# Patient Record
Sex: Male | Born: 1940 | Race: White | Hispanic: No | Marital: Married | Smoking: Former smoker
Health system: Southern US, Community
[De-identification: ages and names within clinical notes are randomized; demographics above are authoritative.]

## PROBLEM LIST (undated history)

## (undated) DIAGNOSIS — G4733 Obstructive sleep apnea (adult) (pediatric): Secondary | ICD-10-CM

## (undated) DIAGNOSIS — K4090 Unilateral inguinal hernia, without obstruction or gangrene, not specified as recurrent: Secondary | ICD-10-CM

## (undated) DIAGNOSIS — G20A1 Parkinson's disease without dyskinesia, without mention of fluctuations: Secondary | ICD-10-CM

## (undated) DIAGNOSIS — R7303 Prediabetes: Secondary | ICD-10-CM

## (undated) DIAGNOSIS — C801 Malignant (primary) neoplasm, unspecified: Secondary | ICD-10-CM

## (undated) DIAGNOSIS — Z974 Presence of external hearing-aid: Secondary | ICD-10-CM

## (undated) DIAGNOSIS — N529 Male erectile dysfunction, unspecified: Secondary | ICD-10-CM

## (undated) DIAGNOSIS — Z8619 Personal history of other infectious and parasitic diseases: Secondary | ICD-10-CM

## (undated) DIAGNOSIS — I10 Essential (primary) hypertension: Secondary | ICD-10-CM

## (undated) DIAGNOSIS — G2581 Restless legs syndrome: Secondary | ICD-10-CM

## (undated) DIAGNOSIS — R1310 Dysphagia, unspecified: Secondary | ICD-10-CM

## (undated) HISTORY — DX: Personal history of other infectious and parasitic diseases: Z86.19

## (undated) HISTORY — DX: Male erectile dysfunction, unspecified: N52.9

## (undated) HISTORY — DX: Prediabetes: R73.03

## (undated) HISTORY — DX: Obstructive sleep apnea (adult) (pediatric): G47.33

## (undated) HISTORY — DX: Parkinson's disease without dyskinesia, without mention of fluctuations: G20.A1

## (undated) HISTORY — DX: Restless legs syndrome: G25.81

---

## 2010-07-27 ENCOUNTER — Ambulatory Visit: Payer: Self-pay | Admitting: Family Medicine

## 2011-10-11 DIAGNOSIS — G5793 Unspecified mononeuropathy of bilateral lower limbs: Secondary | ICD-10-CM | POA: Insufficient documentation

## 2011-10-11 DIAGNOSIS — R739 Hyperglycemia, unspecified: Secondary | ICD-10-CM | POA: Insufficient documentation

## 2011-10-11 DIAGNOSIS — Z8042 Family history of malignant neoplasm of prostate: Secondary | ICD-10-CM | POA: Insufficient documentation

## 2011-10-11 DIAGNOSIS — R809 Proteinuria, unspecified: Secondary | ICD-10-CM | POA: Insufficient documentation

## 2011-10-11 DIAGNOSIS — I10 Essential (primary) hypertension: Secondary | ICD-10-CM | POA: Insufficient documentation

## 2012-05-16 ENCOUNTER — Ambulatory Visit: Payer: Self-pay | Admitting: Unknown Physician Specialty

## 2012-05-16 LAB — HM COLONOSCOPY

## 2012-05-20 LAB — PATHOLOGY REPORT

## 2012-05-26 DIAGNOSIS — Z86010 Personal history of colonic polyps: Secondary | ICD-10-CM | POA: Insufficient documentation

## 2013-04-24 ENCOUNTER — Ambulatory Visit: Payer: Self-pay | Admitting: Family Medicine

## 2013-04-24 DIAGNOSIS — G4733 Obstructive sleep apnea (adult) (pediatric): Secondary | ICD-10-CM | POA: Insufficient documentation

## 2013-04-24 HISTORY — PX: OTHER SURGICAL HISTORY: SHX169

## 2013-05-13 ENCOUNTER — Ambulatory Visit: Payer: Self-pay | Admitting: Family Medicine

## 2013-11-11 LAB — BASIC METABOLIC PANEL
BUN: 11 mg/dL (ref 4–21)
CREATININE: 1.2 mg/dL (ref 0.6–1.3)
GLUCOSE: 113 mg/dL
POTASSIUM: 4.5 mmol/L (ref 3.4–5.3)
SODIUM: 139 mmol/L (ref 137–147)

## 2013-11-11 LAB — LIPID PANEL
Cholesterol: 156 mg/dL (ref 0–200)
HDL: 36 mg/dL (ref 35–70)
LDL CALC: 89 mg/dL
Triglycerides: 153 mg/dL (ref 40–160)

## 2013-11-11 LAB — PSA: PSA: 0.3

## 2013-11-11 LAB — HEMOGLOBIN A1C: HEMOGLOBIN A1C: 6.2

## 2014-06-27 ENCOUNTER — Other Ambulatory Visit: Payer: Self-pay | Admitting: Family Medicine

## 2014-09-19 ENCOUNTER — Ambulatory Visit
Admission: EM | Admit: 2014-09-19 | Discharge: 2014-09-19 | Disposition: A | Discharge status: Home / Self Care | Payer: Medicare Other | Attending: Family Medicine | Admitting: Family Medicine

## 2014-09-19 ENCOUNTER — Ambulatory Visit: Payer: Medicare Other

## 2014-09-19 ENCOUNTER — Encounter: Payer: Self-pay | Admitting: *Deleted

## 2014-09-19 DIAGNOSIS — T148 Other injury of unspecified body region: Secondary | ICD-10-CM | POA: Diagnosis not present

## 2014-09-19 DIAGNOSIS — M25461 Effusion, right knee: Secondary | ICD-10-CM

## 2014-09-19 DIAGNOSIS — T148XXA Other injury of unspecified body region, initial encounter: Secondary | ICD-10-CM

## 2014-09-19 DIAGNOSIS — I10 Essential (primary) hypertension: Secondary | ICD-10-CM

## 2014-09-19 DIAGNOSIS — S8391XA Sprain of unspecified site of right knee, initial encounter: Secondary | ICD-10-CM

## 2014-09-19 DIAGNOSIS — S8392XA Sprain of unspecified site of left knee, initial encounter: Secondary | ICD-10-CM

## 2014-09-19 HISTORY — DX: Essential (primary) hypertension: I10

## 2014-09-19 MED ORDER — BACIT-POLY-NEO HC 1 % EX OINT: 1.0000 "application " | TOPICAL_OINTMENT | Freq: Two times a day (BID) | CUTANEOUS | Status: DC

## 2014-09-19 MED ORDER — ACETAMINOPHEN 500 MG PO TABS: 1000.0000 mg | ORAL_TABLET | Freq: Four times a day (QID) | ORAL | Status: DC | PRN

## 2014-09-19 MED ORDER — CEPHALEXIN 500 MG PO CAPS: 500.0000 mg | ORAL_CAPSULE | Freq: Two times a day (BID) | ORAL | Status: DC

## 2014-09-19 NOTE — ED Provider Notes (Signed)
CSN: [id number]     Arrival date & time 09/19/14  1158 History   First MD Initiated Contact with Patient 09/19/14 1304     Chief Complaint  Patient presents with  . Fall   (Consider location/radiation/quality/duration/timing/severity/associated sxs/prior Treatment) HPI Comments: Married caucasian male here for evaluation of knee and elbow swelling after tripped/fall in parking lot yesterday.  Hit right elbow in asphalt and tore off skin full range of motion slightly sore.  Right knee swelled up a lot he iced overnight and rested/elevated.  Took motrin and tylenol.  Both knees scraped up as wearing shorts.  Applied neosporin to scrapes and washed them out last night.  Denied hitting head or losing consciousness.  Patient requesting xray right knee  The history is provided by the patient.    Past Medical History  Diagnosis Date  . Hypertension    History reviewed. No pertinent past surgical history. History reviewed. No pertinent family history. Social History  Substance Use Topics  . Smoking status: Former Research scientist (life sciences)  . Smokeless tobacco: None  . Alcohol Use: No    Review of Systems  Constitutional: Negative for fever, chills, diaphoresis, activity change, appetite change, fatigue and unexpected weight change.  HENT: Negative for congestion, dental problem, drooling, ear discharge, ear pain and facial swelling.   Eyes: Negative for photophobia, pain, discharge, redness, itching and visual disturbance.  Respiratory: Negative for cough, choking, chest tightness, shortness of breath, wheezing and stridor.   Cardiovascular: Negative for chest pain, palpitations and leg swelling.  Gastrointestinal: Negative for nausea, vomiting, abdominal pain, diarrhea, blood in stool, abdominal distention and rectal pain.  Endocrine: Negative for cold intolerance and heat intolerance.  Genitourinary: Negative for dysuria.  Musculoskeletal: Positive for myalgias, joint swelling and arthralgias. Negative for  back pain, gait problem, neck pain and neck stiffness.  Skin: Positive for color change, rash and wound. Negative for pallor.  Allergic/Immunologic: Negative for environmental allergies and food allergies.  Neurological: Negative for dizziness, tremors, seizures, syncope, facial asymmetry, speech difficulty, weakness, light-headedness, numbness and headaches.  Hematological: Negative for adenopathy. Does not bruise/bleed easily.  Psychiatric/Behavioral: Negative for behavioral problems, confusion, sleep disturbance and agitation.    Allergies  Review of patient's allergies indicates no known allergies.  Home Medications   Prior to Admission medications   Medication Sig Start Date End Date Taking? Authorizing Provider  amLODipine (NORVASC) 5 MG tablet TAKE 1 TABLET BY MOUTH EVERY DAY 06/27/14  Yes Birdie Sons, MD  atenolol (TENORMIN) 100 MG tablet Take 100 mg by mouth daily.   Yes Historical Provider, MD  losartan (COZAAR) 25 MG tablet Take 25 mg by mouth daily.   Yes Historical Provider, MD  acetaminophen (TYLENOL) 500 MG tablet Take 2 tablets (1,000 mg total) by mouth every 6 (six) hours as needed for moderate pain. 09/19/14   Olen Cordial, NP  bacitracin-neomycin-polymyxin-hydrocortisone (CORTISPORIN) 1 % ointment Apply 1 application topically 2 (two) times daily. 09/19/14   Olen Cordial, NP  cephALEXin (KEFLEX) 500 MG capsule Take 1 capsule (500 mg total) by mouth 2 (two) times daily. 09/19/14   Olen Cordial, NP   Meds Ordered and Administered this Visit  Medications - No data to display  BP 163/79 mmHg  Pulse 87  Temp(Src) 97.6 F (36.4 C) (Oral)  Ht 5\' 9"  (1.753 m)  Wt 204 lb (92.534 kg)  BMI 30.11 kg/m2  SpO2 98% No data found.   Physical Exam  Constitutional: He is oriented to person, place, and time.  Vital signs are normal. He appears well-developed and well-nourished. No distress.  HENT:  Head: Normocephalic and atraumatic.  Right Ear: Hearing, tympanic  membrane, external ear and ear canal normal.  Left Ear: Hearing, tympanic membrane, external ear and ear canal normal.  Nose: Nose normal. No mucosal edema, rhinorrhea, nose lacerations, sinus tenderness, nasal deformity, septal deviation or nasal septal hematoma. No epistaxis.  No foreign bodies. Right sinus exhibits no maxillary sinus tenderness and no frontal sinus tenderness. Left sinus exhibits no maxillary sinus tenderness and no frontal sinus tenderness.  Mouth/Throat: Uvula is midline, oropharynx is clear and moist and mucous membranes are normal. Mucous membranes are not pale, not dry and not cyanotic. He does not have dentures. No oral lesions. No trismus in the jaw. Normal dentition. No dental abscesses, uvula swelling, lacerations or dental caries. No oropharyngeal exudate, posterior oropharyngeal edema, posterior oropharyngeal erythema or tonsillar abscesses.  Eyes: Conjunctivae, EOM and lids are normal. Pupils are equal, round, and reactive to light. Right eye exhibits no discharge. Left eye exhibits no discharge. No scleral icterus.  Neck: Trachea normal and normal range of motion. Neck supple. No tracheal tenderness, no spinous process tenderness and no muscular tenderness present. No rigidity. No tracheal deviation, no edema, no erythema and normal range of motion present.  Cardiovascular: Normal rate, regular rhythm, normal heart sounds and intact distal pulses.  Exam reveals no gallop and no friction rub.   No murmur heard. Pulmonary/Chest: Effort normal and breath sounds normal. No accessory muscle usage or stridor. No respiratory distress. He has no decreased breath sounds. He has no wheezes. He has no rhonchi. He has no rales. He exhibits no tenderness.  Abdominal: Soft. He exhibits no distension.  Musculoskeletal: He exhibits edema and tenderness.       Right shoulder: Normal.       Left shoulder: Normal.       Right elbow: He exhibits decreased range of motion, swelling and  laceration. He exhibits no effusion and no deformity. Tenderness found. No radial head, no medial epicondyle, no lateral epicondyle and no olecranon process tenderness noted.       Left elbow: Normal.       Right wrist: Normal.       Left wrist: Normal.       Right hip: Normal.       Left hip: Normal.       Right knee: He exhibits decreased range of motion, swelling, effusion, ecchymosis, erythema and bony tenderness. He exhibits no deformity, no laceration, normal alignment, no LCL laxity, normal patellar mobility, normal meniscus and no MCL laxity. Tenderness found. Patellar tendon tenderness noted. No medial joint line, no lateral joint line, no MCL and no LCL tenderness noted.       Left knee: Normal.       Right ankle: Normal.       Left ankle: Normal.       Cervical back: Normal.       Thoracic back: Normal.       Lumbar back: Normal.       Right forearm: Normal.       Left forearm: Normal.       Arms:      Right hand: Normal.       Left hand: Normal.       Right upper leg: Normal.       Left upper leg: Normal.       Right lower leg: Normal.       Left  lower leg: Normal.       Legs:      Right foot: Normal.       Left foot: Normal.  0-1+/4 right elbow edema full AROM tender over laceration soft tissue only no bony tenderness on evaluation; negative ant/post drawer tests bilateral knees; valgus/varus stress no laxity; patellar tendons TTP along with right patellar with direct pressure; left knee crepitus with flexion/extension  Lymphadenopathy:    He has no cervical adenopathy.  Neurological: He is alert and oriented to person, place, and time. He exhibits normal muscle tone. Coordination normal.  Skin: Skin is warm, dry and intact. Rash noted. He is not diaphoretic. There is erythema. No pallor.  Psychiatric: He has a normal mood and affect. His speech is normal and behavior is normal. Judgment and thought content normal. Cognition and memory are normal.  Nursing note and vitals  reviewed.   ED Course  Procedures (including critical care time)  Labs Review Labs Reviewed - No data to display  Imaging Review No results found.   1350 wet read did not note fracture.  Discussed with patient will call with official read once available.  Patient verbalized understanding of information/instructions, agreed with plan of care and had no further questions at this time.   1415 patient had not left clinic discussed xray results no fracture or dislocation noted.  Swelling in soft tissues probable bone contusion along with soft tissue continue knee right.  Patient given copy of radiology report plan of care unchanged.  Patient verbalized understanding of information/instructions, agreed with plan of care and had no further questions at this time.  MDM   1. Abrasion   2. Contusion   3. Knee sprain and strain, left, initial encounter   4. Knee sprain and strain, right, initial encounter   5. Knee effusion, right    Patient was instructed to rest extremities.  Do not soak arms until abrasions healed avoid pool, lake, hot tub, dirty sink water.  May shower apply neosporin or bactroban BID keep wounds covered they will heal faster and prevent contamination rubbing from clothing tearing off scabs.  Exitcare handout on contusion, abrasion given to patient.   Medications as directed.  Call or return to clinic as needed if these symptoms worsen or fail to improve as anticipated.  Rx given for keflex 500mg  po BID x 10 days.  Patient to continue to monitor for erythema spreading, purulent discharge, worsening swelling/pain under abrasions and if noted start keflex.  Patient verbalized agreement and understanding of treatment plan.  P2:  ROM, injury prevention  Patient was instructed to rest, ice and elevate legs and right elbow.  Avoid impact activities bilateral feet/knees/right hand (elbow) for exercise this next two weeks.  Exitcare handout on knee sprain with rehab exercises given to  patient.  Medications as directed. Tylenol 1000mg  po QID prn.  Discussed motrin/naproxen would increase blood pressure if taken OTC. Patient may take NSAIDS as needed.  Call or return to clinic as needed if these symptoms worsen or fail to improve as anticipated.  If continue swelling and pain that is not decreasing over next two weeks follow up with [facility] and consider orthopedic evaluation/reimaging.  Patient verbalized agreement and understanding of treatment plan and had no further questions at this time.   P2:  ROM exercises, Stretching, and Hand out given   Olen Cordial, NP 09/20/14 2128

## 2014-09-19 NOTE — Discharge Instructions (Signed)
Abrasion An abrasion is a cut or scrape of the skin. Abrasions do not extend through all layers of the skin and most heal within 10 days. It is important to care for your abrasion properly to prevent infection. CAUSES  Most abrasions are caused by falling on, or gliding across, the ground or other surface. When your skin rubs on something, the outer and inner layer of skin rubs off, causing an abrasion. DIAGNOSIS  Your caregiver will be able to diagnose an abrasion during a physical exam.  TREATMENT  Your treatment depends on how large and deep the abrasion is. Generally, your abrasion will be cleaned with water and a mild soap to remove any dirt or debris. An antibiotic ointment may be put over the abrasion to prevent an infection. A bandage (dressing) may be wrapped around the abrasion to keep it from getting dirty.  You may need a tetanus shot if:  You cannot remember when you had your last tetanus shot.  You have never had a tetanus shot.  The injury broke your skin. If you get a tetanus shot, your arm may swell, get red, and feel warm to the touch. This is common and not a problem. If you need a tetanus shot and you choose not to have one, there is a rare chance of getting tetanus. Sickness from tetanus can be serious.  HOME CARE INSTRUCTIONS   If a dressing was applied, change it at least once a day or as directed by your caregiver. If the bandage sticks, soak it off with warm water.   Wash the area with water and a mild soap to remove all the ointment 2 times a day. Rinse off the soap and pat the area dry with a clean towel.   Reapply any ointment as directed by your caregiver. This will help prevent infection and keep the bandage from sticking. Use gauze over the wound and under the dressing to help keep the bandage from sticking.   Change your dressing right away if it becomes wet or dirty.   Only take over-the-counter or prescription medicines for pain, discomfort, or fever as  directed by your caregiver.   Follow up with your caregiver within 24-48 hours for a wound check, or as directed. If you were not given a wound-check appointment, look closely at your abrasion for redness, swelling, or pus. These are signs of infection. SEEK IMMEDIATE MEDICAL CARE IF:   You have increasing pain in the wound.   You have redness, swelling, or tenderness around the wound.   You have pus coming from the wound.   You have a fever or persistent symptoms for more than 2-3 days.  You have a fever and your symptoms suddenly get worse.  You have a bad smell coming from the wound or dressing.  MAKE SURE YOU:   Understand these instructions.  Will watch your condition.  Will get help right away if you are not doing well or get worse. Document Released: 10/04/2004 Document Revised: 12/12/2011 Document Reviewed: 11/28/2010 [facility] Patient Information 2015 [place], [place]. This information is not intended to replace advice given to you by your health care provider. Make sure you discuss any questions you have with your health care provider. Knee Effusion The medical term for having fluid in your knee is effusion. This is often due to an internal derangement of the knee. This means something is wrong inside the knee. Some of the causes of fluid in the knee may be torn cartilage, a  torn ligament, or bleeding into the joint from an injury. Your knee is likely more difficult to bend and move. This is often because there is increased pain and pressure in the joint. The time it takes for recovery from a knee effusion depends on different factors, including:   Type of injury.  Your age.  Physical and medical conditions.  Rehabilitation Strategies. How long you will be away from your normal activities will depend on what kind of knee problem you have and how much damage is present. Your knee has two types of cartilage. Articular cartilage covers the bone ends and lets your knee  bend and move smoothly. Two menisci, thick pads of cartilage that form a rim inside the joint, help absorb shock and stabilize your knee. Ligaments bind the bones together and support your knee joint. Muscles move the joint, help support your knee, and take stress off the joint itself. CAUSES  Often an effusion in the knee is caused by an injury to one of the menisci. This is often a tear in the cartilage. Recovery after a meniscus injury depends on how much meniscus is damaged and whether you have damaged other knee tissue. Small tears may heal on their own with conservative treatment. Conservative means rest, limited weight bearing activity and muscle strengthening exercises. Your recovery may take up to 6 weeks.  TREATMENT  Larger tears may require surgery. Meniscus injuries may be treated during arthroscopy. Arthroscopy is a procedure in which your surgeon uses a small telescope like instrument to look in your knee. Your caregiver can make a more accurate diagnosis (learning what is wrong) by performing an arthroscopic procedure. If your injury is on the inner margin of the meniscus, your surgeon may trim the meniscus back to a smooth rim. In other cases your surgeon will try to repair a damaged meniscus with stitches (sutures). This may make rehabilitation take longer, but may provide better long term result by helping your knee keep its shock absorption capabilities. Ligaments which are completely torn usually require surgery for repair. HOME CARE INSTRUCTIONS  Use crutches as instructed.  If a brace is applied, use as directed.  Once you are home, an ice pack applied to your swollen knee may help with discomfort and help decrease swelling.  Keep your knee raised (elevated) when you are not up and around or on crutches.  Only take over-the-counter or prescription medicines for pain, discomfort, or fever as directed by your caregiver.  Your caregivers will help with instructions for  rehabilitation of your knee. This often includes strengthening exercises.  You may resume a normal diet and activities as directed. SEEK MEDICAL CARE IF:   There is increased swelling in your knee.  You notice redness, swelling, or increasing pain in your knee.  An unexplained oral temperature above 102 F (38.9 C) develops. SEEK IMMEDIATE MEDICAL CARE IF:   You develop a rash.  You have difficulty breathing.  You have any allergic reactions from medications you may have been given.  There is severe pain with any motion of the knee. MAKE SURE YOU:   Understand these instructions.  Will watch your condition.  Will get help right away if you are not doing well or get worse. Document Released: 03/17/2003 Document Revised: 03/19/2011 Document Reviewed: 05/21/2007 [facility] Patient Information 2015 [place], [place]. This information is not intended to replace advice given to you by your health care provider. Make sure you discuss any questions you have with your health care provider. Knee  Exercises EXERCISES RANGE OF MOTION (ROM) AND STRETCHING EXERCISES These exercises may help you when beginning to rehabilitate your injury. Your symptoms may resolve with or without further involvement from your physician, physical therapist, or athletic trainer. While completing these exercises, remember:   Restoring tissue flexibility helps normal motion to return to the joints. This allows healthier, less painful movement and activity.  An effective stretch should be held for at least 30 seconds.  A stretch should never be painful. You should only feel a gentle lengthening or release in the stretched tissue. STRETCH - Knee Extension, Prone  Lie on your stomach on a firm surface, such as a bed or countertop. Place your right / left knee and leg just beyond the edge of the surface. You may wish to place a towel under the far end of your right / left thigh for comfort.  Relax your leg muscles and  allow gravity to straighten your knee. Your clinician may advise you to add an ankle weight if more resistance is helpful for you.  You should feel a stretch in the back of your right / left knee. Hold this position for __________ seconds. Repeat __________ times. Complete this stretch __________ times per day. * Your physician, physical therapist, or athletic trainer may ask you to add ankle weight to enhance your stretch.  RANGE OF MOTION - Knee Flexion, Active  Lie on your back with both knees straight. (If this causes back discomfort, bend your opposite knee, placing your foot flat on the floor.)  Slowly slide your heel back toward your buttocks until you feel a gentle stretch in the front of your knee or thigh.  Hold for __________ seconds. Slowly slide your heel back to the starting position. Repeat __________ times. Complete this exercise __________ times per day.  STRETCH - Quadriceps, Prone   Lie on your stomach on a firm surface, such as a bed or padded floor.  Bend your right / left knee and grasp your ankle. If you are unable to reach your ankle or pant leg, use a belt around your foot to lengthen your reach.  Gently pull your heel toward your buttocks. Your knee should not slide out to the side. You should feel a stretch in the front of your thigh and/or knee.  Hold this position for __________ seconds. Repeat __________ times. Complete this stretch __________ times per day.  STRETCH - Hamstrings, Supine   Lie on your back. Loop a belt or towel over the ball of your right / left foot.  Straighten your right / left knee and slowly pull on the belt to raise your leg. Do not allow the right / left knee to bend. Keep your opposite leg flat on the floor.  Raise the leg until you feel a gentle stretch behind your right / left knee or thigh. Hold this position for __________ seconds. Repeat __________ times. Complete this stretch __________ times per day.  STRENGTHENING  EXERCISES These exercises may help you when beginning to rehabilitate your injury. They may resolve your symptoms with or without further involvement from your physician, physical therapist, or athletic trainer. While completing these exercises, remember:   Muscles can gain both the endurance and the strength needed for everyday activities through controlled exercises.  Complete these exercises as instructed by your physician, physical therapist, or athletic trainer. Progress the resistance and repetitions only as guided.  You may experience muscle soreness or fatigue, but the pain or discomfort you are trying to eliminate  should never worsen during these exercises. If this pain does worsen, stop and make certain you are following the directions exactly. If the pain is still present after adjustments, discontinue the exercise until you can discuss the trouble with your clinician. STRENGTH - Quadriceps, Isometrics  Lie on your back with your right / left leg extended and your opposite knee bent.  Gradually tense the muscles in the front of your right / left thigh. You should see either your knee cap slide up toward your hip or increased dimpling just above the knee. This motion will push the back of the knee down toward the floor/mat/bed on which you are lying.  Hold the muscle as tight as you can without increasing your pain for __________ seconds.  Relax the muscles slowly and completely in between each repetition. Repeat __________ times. Complete this exercise __________ times per day.  STRENGTH - Quadriceps, Short Arcs   Lie on your back. Place a __________ inch towel roll under your knee so that the knee slightly bends.  Raise only your lower leg by tightening the muscles in the front of your thigh. Do not allow your thigh to rise.  Hold this position for __________ seconds. Repeat __________ times. Complete this exercise __________ times per day.  OPTIONAL ANKLE WEIGHTS: Begin with  ____________________, but DO NOT exceed ____________________. Increase in 1 pound/0.5 kilogram increments.  STRENGTH - Quadriceps, Straight Leg Raises  Quality counts! Watch for signs that the quadriceps muscle is working to insure you are strengthening the correct muscles and not "cheating" by substituting with healthier muscles.  Lay on your back with your right / left leg extended and your opposite knee bent.  Tense the muscles in the front of your right / left thigh. You should see either your knee cap slide up or increased dimpling just above the knee. Your thigh may even quiver.  Tighten these muscles even more and raise your leg 4 to 6 inches off the floor. Hold for __________ seconds.  Keeping these muscles tense, lower your leg.  Relax the muscles slowly and completely in between each repetition. Repeat __________ times. Complete this exercise __________ times per day.  STRENGTH - Hamstring, Curls  Lay on your stomach with your legs extended. (If you lay on a bed, your feet may hang over the edge.)  Tighten the muscles in the back of your thigh to bend your right / left knee up to 90 degrees. Keep your hips flat on the bed/floor.  Hold this position for __________ seconds.  Slowly lower your leg back to the starting position. Repeat __________ times. Complete this exercise __________ times per day.  OPTIONAL ANKLE WEIGHTS: Begin with ____________________, but DO NOT exceed ____________________. Increase in 1 pound/0.5 kilogram increments.  STRENGTH - Quadriceps, Squats  Stand in a door frame so that your feet and knees are in line with the frame.  Use your hands for balance, not support, on the frame.  Slowly lower your weight, bending at the hips and knees. Keep your lower legs upright so that they are parallel with the door frame. Squat only within the range that does not increase your knee pain. Never let your hips drop below your knees.  Slowly return upright, pushing  with your legs, not pulling with your hands. Repeat __________ times. Complete this exercise __________ times per day.  STRENGTH - Quadriceps, Wall Slides  Follow guidelines for form closely. Increased knee pain often results from poorly placed feet or knees.  [employer]  against a smooth wall or door and walk your feet out 18-24 inches. Place your feet hip-width apart.  Slowly slide down the wall or door until your knees bend __________ degrees.* Keep your knees over your heels, not your toes, and in line with your hips, not falling to either side.  Hold for __________ seconds. Stand up to rest for __________ seconds in between each repetition. Repeat __________ times. Complete this exercise __________ times per day. * Your physician, physical therapist, or athletic trainer will alter this angle based on your symptoms and progress. Document Released: 11/08/2004 Document Revised: 05/11/2013 Document Reviewed: 04/08/2008 [facility] Patient Information 2015 [place], [place]. This information is not intended to replace advice given to you by your health care provider. Make sure you discuss any questions you have with your health care provider. Knee Sprain A knee sprain is a tear in one of the strong, fibrous tissues that connect the bones (ligaments) in your knee. The severity of the sprain depends on how much of the ligament is torn. The tear can be either partial or complete. CAUSES  Often, sprains are a result of a fall or injury. The force of the impact causes the fibers of your ligament to stretch too much. This excess tension causes the fibers of your ligament to tear. SIGNS AND SYMPTOMS  You may have some loss of motion in your knee. Other symptoms include:  Bruising.  Pain in the knee area.  Tenderness of the knee to the touch.  Swelling. DIAGNOSIS  To diagnose a knee sprain, your health care provider will physically examine your knee. Your health care provider may also suggest an X-ray exam  of your knee to make sure no bones are broken. TREATMENT  If your ligament is only partially torn, treatment usually involves keeping the knee in a fixed position (immobilization) or bracing your knee for activities that require movement for several weeks. To do this, your health care provider will apply a bandage, cast, or splint to keep your knee from moving and to support your knee during movement until it heals. For a partially torn ligament, the healing process usually takes 4-6 weeks. If your ligament is completely torn, depending on which ligament it is, you may need surgery to reconnect the ligament to the bone or reconstruct it. After surgery, a cast or splint may be applied and will need to stay on your knee for 4-6 weeks while your ligament heals. HOME CARE INSTRUCTIONS  Keep your injured knee elevated to decrease swelling.  To ease pain and swelling, apply ice to the injured area:  Put ice in a plastic bag.  Place a towel between your skin and the bag.  Leave the ice on for 20 minutes, 2-3 times a day.  Only take medicine for pain as directed by your health care provider.  Do not leave your knee unprotected until pain and stiffness go away (usually 4-6 weeks).  If you have a cast or splint, do not allow it to get wet. If you have been instructed not to remove it, cover it with a plastic bag when you shower or bathe. Do not swim.  Your health care provider may suggest exercises for you to do during your recovery to prevent or limit permanent weakness and stiffness. SEEK IMMEDIATE MEDICAL CARE IF:  Your cast or splint becomes damaged.  Your pain becomes worse.  You have significant pain, swelling, or numbness below the cast or splint. MAKE SURE YOU:  Understand these instructions.  Will watch your condition.  Will get help right away if you are not doing well or get worse. Document Released: 12/25/2004 Document Revised: 10/15/2012 Document Reviewed:  08/06/2012 [facility] Patient Information 2015 [place], [place]. This information is not intended to replace advice given to you by your health care provider. Make sure you discuss any questions you have with your health care provider. Contusion A contusion is a deep bruise. Contusions are the result of an injury that caused bleeding under the skin. The contusion may turn blue, purple, or yellow. Minor injuries will give you a painless contusion, but more severe contusions may stay painful and swollen for a few weeks.  CAUSES  A contusion is usually caused by a blow, trauma, or direct force to an area of the body. SYMPTOMS   Swelling and redness of the injured area.  Bruising of the injured area.  Tenderness and soreness of the injured area.  Pain. DIAGNOSIS  The diagnosis can be made by taking a history and physical exam. An X-ray, CT scan, or MRI may be needed to determine if there were any associated injuries, such as fractures. TREATMENT  Specific treatment will depend on what area of the body was injured. In general, the best treatment for a contusion is resting, icing, elevating, and applying cold compresses to the injured area. Over-the-counter medicines may also be recommended for pain control. Ask your caregiver what the best treatment is for your contusion. HOME CARE INSTRUCTIONS   Put ice on the injured area.  Put ice in a plastic bag.  Place a towel between your skin and the bag.  Leave the ice on for 15-20 minutes, 3-4 times a day, or as directed by your health care provider.  Only take over-the-counter or prescription medicines for pain, discomfort, or fever as directed by your caregiver. Your caregiver may recommend avoiding anti-inflammatory medicines (aspirin, ibuprofen, and naproxen) for 48 hours because these medicines may increase bruising.  Rest the injured area.  If possible, elevate the injured area to reduce swelling. SEEK IMMEDIATE MEDICAL CARE IF:   You have  increased bruising or swelling.  You have pain that is getting worse.  Your swelling or pain is not relieved with medicines. MAKE SURE YOU:   Understand these instructions.  Will watch your condition.  Will get help right away if you are not doing well or get worse. Document Released: 10/04/2004 Document Revised: 12/30/2012 Document Reviewed: 10/30/2010 [facility] Patient Information 2015 [place], [place]. This information is not intended to replace advice given to you by your health care provider. Make sure you discuss any questions you have with your health care provider.

## 2014-09-19 NOTE — ED Notes (Signed)
Pt fell outside in a parking lot at [facility] and injured right knee and right elbow yesterday afternoon

## 2014-12-16 ENCOUNTER — Other Ambulatory Visit: Payer: Self-pay | Admitting: Family Medicine

## 2014-12-16 DIAGNOSIS — I10 Essential (primary) hypertension: Secondary | ICD-10-CM

## 2014-12-17 NOTE — Telephone Encounter (Signed)
Please advise patient it has been over a year since his he was last seen. Need to scheduled follow up or yearly physical. OK to refill atenolol once he makes appointment.

## 2014-12-20 ENCOUNTER — Other Ambulatory Visit: Payer: Self-pay | Admitting: *Deleted

## 2014-12-20 NOTE — Telephone Encounter (Signed)
Rx request already sent to pcp. See phone note.

## 2014-12-21 NOTE — Telephone Encounter (Signed)
Advised patient as below. I misread message and sent in med, but #[phone] with no refills. Patient told me that he will give me a call to schedule. I apologize sending in med was my fault. I will cancel Rx if I need to. Thanks.

## 2015-01-04 ENCOUNTER — Other Ambulatory Visit: Payer: Self-pay | Admitting: Family Medicine

## 2015-01-18 ENCOUNTER — Ambulatory Visit (INDEPENDENT_AMBULATORY_CARE_PROVIDER_SITE_OTHER): Payer: Medicare Other | Admitting: Family Medicine

## 2015-01-18 ENCOUNTER — Encounter: Payer: Self-pay | Admitting: Family Medicine

## 2015-01-18 ENCOUNTER — Other Ambulatory Visit: Payer: Self-pay | Admitting: Family Medicine

## 2015-01-18 VITALS — BP 128/68 | HR 67 | Temp 98.4°F | Resp 16 | Ht 68.5 in | Wt 201.0 lb

## 2015-01-18 DIAGNOSIS — Z125 Encounter for screening for malignant neoplasm of prostate: Secondary | ICD-10-CM

## 2015-01-18 DIAGNOSIS — G2581 Restless legs syndrome: Secondary | ICD-10-CM | POA: Insufficient documentation

## 2015-01-18 DIAGNOSIS — Z0000 Encounter for general adult medical examination without abnormal findings: Secondary | ICD-10-CM | POA: Diagnosis not present

## 2015-01-18 DIAGNOSIS — H9192 Unspecified hearing loss, left ear: Secondary | ICD-10-CM | POA: Insufficient documentation

## 2015-01-18 DIAGNOSIS — I890 Lymphedema, not elsewhere classified: Secondary | ICD-10-CM | POA: Insufficient documentation

## 2015-01-18 DIAGNOSIS — G479 Sleep disorder, unspecified: Secondary | ICD-10-CM | POA: Insufficient documentation

## 2015-01-18 DIAGNOSIS — N529 Male erectile dysfunction, unspecified: Secondary | ICD-10-CM | POA: Insufficient documentation

## 2015-01-18 DIAGNOSIS — I10 Essential (primary) hypertension: Secondary | ICD-10-CM

## 2015-01-18 DIAGNOSIS — R251 Tremor, unspecified: Secondary | ICD-10-CM | POA: Insufficient documentation

## 2015-01-18 DIAGNOSIS — G4733 Obstructive sleep apnea (adult) (pediatric): Secondary | ICD-10-CM | POA: Diagnosis not present

## 2015-01-18 DIAGNOSIS — R7303 Prediabetes: Secondary | ICD-10-CM | POA: Insufficient documentation

## 2015-01-18 MED ORDER — TADALAFIL 5 MG PO TABS: 5.0000 mg | ORAL_TABLET | Freq: Every day | ORAL | Status: DC

## 2015-01-18 NOTE — Progress Notes (Signed)
Patient: Alexander Espinoza, Male    DOB: 1940-06-16, 75 y.o.   MRN: [id number] Visit Date: 01/18/2015  Today's Provider: Lelon Huh, MD   Chief Complaint  Patient presents with  . Annual Exam  . Hypertension  . Sleep Apnea  . Erectile Dysfunction   Subjective:    Annual wellness visit Alexander Espinoza is a 75 y.o. male. He feels well. He reports exercising yes/walking. He reports he is sleeping well.  -----------------------------------------------------------  Follow-up for pre-diabetes from 11/10/2013; recommended avoiding sweets and starchy foods. Follow-up for erectile dysfunction from 11/10/2013; started Cialis 5 mg qd. Follow-up for obstructive sleep apnea from 11/10/2013; continue cpap.    Hypertension, follow-up:  BP Readings from Last 3 Encounters:  01/18/15 128/68  11/10/13 128/70  09/19/14 163/79    He was last seen for hypertension 64months ago.  BP at that visit was 128/70. Management since that visit includes; no changes.He reports good compliance with treatment. He is not having side effects. none  He is exercising. He is adherent to low salt diet.   Outside blood pressures are n/a. He is experiencing none.  Patient denies none.   Cardiovascular risk factors include none.  Use of agents associated with hypertension: none.   ----------------------------------------------------------------------     Review of Systems  Constitutional: Positive for chills.       Hands stay cold all the time  HENT: Positive for hearing loss.        Almost 20 years ago  Eyes: Negative.   Respiratory: Negative.   Cardiovascular: Negative.   Gastrointestinal: Negative.   Endocrine: Negative.   Genitourinary: Negative.   Musculoskeletal: Negative.   Skin: Negative.   Allergic/Immunologic: Negative.   Neurological: Positive for tremors.  Hematological: Negative.   Psychiatric/Behavioral: Negative.     Social History   Social History  . Marital Status:  Married    Spouse Name: N/A  . Number of Children: N/A  . Years of Education: N/A   Occupational History  . retired    Social History Main Topics  . Smoking status: Former Smoker -- 1.00 packs/day for 13 years    Types: Cigarettes    Quit date: 01/09/1968  . Smokeless tobacco: Not on file  . Alcohol Use: 0.0 oz/week    0 Standard drinks or equivalent per week     Comment: one beer evert once in a while  . Drug Use: No  . Sexual Activity: Not on file   Other Topics Concern  . Not on file   Social History Narrative    Past Medical History  Diagnosis Date  . Hypertension   . History of mumps   . Restless leg   . OSA (obstructive sleep apnea)   . ED (erectile dysfunction)   . Prediabetes      Patient Active Problem List   Diagnosis Date Noted  . Erectile dysfunction 01/18/2015  . 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 Prediabetes 01/18/2015  . Restless leg 01/18/2015  . Sleep disturbance 01/18/2015  . Tremor 01/18/2015  . Obstructive sleep apnea 04/24/2013  . Hypertension 10/11/2011  . 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 Family history of malignant neoplasm of prostate 10/11/2011  . Proteinuria 10/11/2011    Past Surgical History  Procedure Laterality Date  . Sleep study  04/24/2013    AHI=15.9, RDI=17.1 on MSPG    His family history includes Parkinsonism in his maternal uncle; Prostate  cancer in his father; Stroke in his sister.    Previous Medications   ACETAMINOPHEN (TYLENOL) 500 MG TABLET    Take 2 tablets (1,000 mg total) by mouth every 6 (six) hours as needed for moderate pain.   AMLODIPINE (NORVASC) 5 MG TABLET    TAKE 1 TABLET BY MOUTH EVERY DAY   ATENOLOL (TENORMIN) 100 MG TABLET    TAKE 1 TABLET BY MOUTH ONCE DAILY   BACITRACIN-NEOMYCIN-POLYMYXIN-HYDROCORTISONE (CORTISPORIN) 1 % OINTMENT    Apply 1 application topically 2 (two) times daily.   CEPHALEXIN (KEFLEX) 500 MG CAPSULE    Take 1 capsule (500 mg  total) by mouth 2 (two) times daily.   LOSARTAN (COZAAR) 25 MG TABLET    Take 25 mg by mouth daily.   LOSARTAN-HYDROCHLOROTHIAZIDE (HYZAAR) 100-25 MG TABLET    Take 1 tablet by mouth daily.   TADALAFIL (CIALIS) 5 MG TABLET    Take 1 tablet by mouth daily.    Patient Care Team: Birdie Sons, MD as PCP - General (Family Medicine)     Objective:   Vitals: BP 128/68 mmHg  Pulse 67  Temp(Src) 98.4 F (36.9 C) (Oral)  Resp 16  Ht 5' 8.5" (1.74 m)  Wt 201 lb (91.173 kg)  BMI 30.11 kg/m2  SpO2 98%  Physical Exam   General Appearance:    Alert, cooperative, no distress, appears stated age  Head:    Normocephalic, without obvious abnormality, atraumatic  Eyes:    PERRL, conjunctiva/corneas clear, EOM's intact, fundi    benign, both eyes       Ears:    Normal TM's and external ear canals, both ears  Nose:   Nares normal, septum midline, mucosa normal, no drainage   or sinus tenderness  Throat:   Lips, mucosa, and tongue normal; teeth and gums normal  Neck:   Supple, symmetrical, trachea midline, no adenopathy;       thyroid:  No enlargement/tenderness/nodules; no carotid   bruit or JVD  Back:     Symmetric, no curvature, ROM normal, no CVA tenderness  Lungs:     Clear to auscultation bilaterally, respirations unlabored  Chest wall:    No tenderness or deformity  Heart:    Regular rate and rhythm, S1 and S2 normal, no murmur, rub   or gallop  Abdomen:     Soft, non-tender, bowel sounds active all four quadrants,    no masses, no organomegaly  Genitalia:    deferred  Rectal:    normal tone, normal prostate, no masses or tenderness  Extremities:   Extremities normal, atraumatic, no cyanosis or edema  Pulses:   2+ and symmetric all extremities  Skin:   Skin color, texture, turgor normal, no rashes or lesions  Lymph nodes:   Cervical, supraclavicular, and axillary nodes normal  Neurologic:   CNII-XII intact. Normal strength, sensation and reflexes      throughout    Activities  of Daily Living In your present state of health, do you have any difficulty performing the following activities: 01/18/2015  Hearing? Y  Vision? N  Difficulty concentrating or making decisions? N  Walking or climbing stairs? N  Dressing or bathing? N  Doing errands, shopping? N    Fall Risk Assessment Fall Risk  01/18/2015  Falls in the past year? No     Depression Screen PHQ 2/9 Scores 01/18/2015  PHQ - 2 Score 0    Cognitive Testing - 6-CIT  Patient declined.   Audit-C Alcohol Use Screening  Question Answer Points  How often do you have alcoholic drink? never 0  On days you do drink alcohol, how many drinks do you typically consume? n/a 0  How oftey will you drink 6 or more in a total? never 0  Total Score:  0   A score of 3 or more in women, and 4 or more in men indicates increased risk for alcohol abuse, EXCEPT if all of the points are from question 1.    Assessment & Plan:     Annual Physical  Reviewed patient's Family Medical History Reviewed and updated list of patient's medical providers Assessment of cognitive impairment was done Assessed patient's functional ability Established a written schedule for health screening [place] Completed and Reviewed  Exercise Activities and Dietary recommendations Goals    None      Immunization History  Administered Date(s) Administered  . Influenza-Unspecified 11/03/2014  . Pneumococcal Conjugate-13 04/23/2013  . Pneumococcal Polysaccharide-23 01/11/2011    Health Maintenance  Topic Date Due  . Samul Dada  12/30/1961  . ZOSTAVAX  12/31/2002  . PNA vac Low Risk Adult (1 of 2 - PCV13) 12/31/2007  . INFLUENZA VACCINE  08/09/2014  . COLONOSCOPY  05/17/2022      Discussed health benefits of physical activity, and encouraged him to engage in regular exercise appropriate for his age and condition.     --------------------------------------------------------------------------------    1. Annual physical exam Had flu shot at Pharmacy in October. Given prescription for Zostavax.   2. Essential hypertension Well controlled.  Continue current medications.   - Renal function panel - EKG 12-Lead  3. Obstructive sleep apnea Doing well on CPAP.  4. Prediabetes  - Hemoglobin A1c  5. Prostate cancer screening  - PSA

## 2015-01-24 DIAGNOSIS — I10 Essential (primary) hypertension: Secondary | ICD-10-CM | POA: Diagnosis not present

## 2015-01-24 DIAGNOSIS — R7303 Prediabetes: Secondary | ICD-10-CM | POA: Diagnosis not present

## 2015-01-24 DIAGNOSIS — Z125 Encounter for screening for malignant neoplasm of prostate: Secondary | ICD-10-CM | POA: Diagnosis not present

## 2015-01-25 LAB — RENAL FUNCTION PANEL
ALBUMIN: 4.3 g/dL (ref 3.5–4.8)
BUN / CREAT RATIO: 13 (ref 10–22)
BUN: 15 mg/dL (ref 8–27)
CO2: 24 mmol/L (ref 18–29)
Calcium: 9.5 mg/dL (ref 8.6–10.2)
Chloride: 96 mmol/L (ref 96–106)
Creatinine, Ser: 1.17 mg/dL (ref 0.76–1.27)
GFR, EST AFRICAN AMERICAN: 72 mL/min/{1.73_m2} (ref >59)
GFR, EST NON AFRICAN AMERICAN: 62 mL/min/{1.73_m2} (ref >59)
GLUCOSE: 121 mg/dL — AB (ref 65–99)
POTASSIUM: 4.3 mmol/L (ref 3.5–5.2)
Phosphorus: 3.1 mg/dL (ref 2.5–4.5)
SODIUM: 142 mmol/L (ref 134–144)

## 2015-01-25 LAB — HEMOGLOBIN A1C
ESTIMATED AVERAGE GLUCOSE: 131 mg/dL
HEMOGLOBIN A1C: 6.2 % — AB (ref 4.8–5.6)

## 2015-01-25 LAB — PSA: Prostate Specific Ag, Serum: 0.3 ng/mL (ref 0.0–4.0)

## 2015-02-14 DIAGNOSIS — Z872 Personal history of diseases of the skin and subcutaneous tissue: Secondary | ICD-10-CM | POA: Diagnosis not present

## 2015-02-14 DIAGNOSIS — L570 Actinic keratosis: Secondary | ICD-10-CM | POA: Diagnosis not present

## 2015-02-14 DIAGNOSIS — Z1283 Encounter for screening for malignant neoplasm of skin: Secondary | ICD-10-CM | POA: Diagnosis not present

## 2015-03-11 ENCOUNTER — Other Ambulatory Visit: Payer: Self-pay | Admitting: Family Medicine

## 2015-03-26 ENCOUNTER — Other Ambulatory Visit: Payer: Self-pay | Admitting: Family Medicine

## 2015-04-03 ENCOUNTER — Other Ambulatory Visit: Payer: Self-pay | Admitting: Family Medicine

## 2015-07-21 ENCOUNTER — Telehealth: Payer: Self-pay | Admitting: Family Medicine

## 2015-07-21 NOTE — Telephone Encounter (Signed)
Pharmacist from [facility] called saying that Atenolol is currently on back order and will not be available until late August. They have [facility] other [facility] for the supply but all have a wait list. They are wanting to know if you would like to change patient's medication until the Atenolol comes in? Please advise. Thanks!

## 2015-07-22 MED ORDER — METOPROLOL SUCCINATE ER 100 MG PO TB24: 100.0000 mg | ORAL_TABLET | Freq: Every day | ORAL | Status: DC

## 2015-07-22 NOTE — Telephone Encounter (Signed)
Can change to metoprolol succinate 100mg  once daily. Thanks.

## 2015-07-22 NOTE — Telephone Encounter (Signed)
- 

## 2015-10-21 ENCOUNTER — Telehealth: Payer: Self-pay | Admitting: Family Medicine

## 2015-10-21 MED ORDER — METOPROLOL SUCCINATE ER 100 MG PO TB24: 100.0000 mg | ORAL_TABLET | Freq: Every day | ORAL | 3 refills | Status: DC

## 2015-10-21 NOTE — Telephone Encounter (Signed)
Spoke with pt's wife who stated that they were looking at an old bottle of atenolol that they have not thrown away yet. They did need refills for metoprolol. Rx sent to pharmacy.

## 2015-10-21 NOTE — Telephone Encounter (Signed)
Pt's wife Olin Hauser stated that [facility] advised her they no longer carry atenolol (TENORMIN) 100 MG tablet  And don't know any other pharmacy that might carry it either. Olin Hauser stated she was advised to call and request something else be called into [facility] [place] to replace that medication. Please advise. Thanks TNP

## 2015-10-21 NOTE — Telephone Encounter (Signed)
Please advise 

## 2015-10-21 NOTE — Telephone Encounter (Signed)
Should be on metoprolol instead of atenolol. Metoprolol with refills was called in in July, If refills have expired can send new rx for #30 and 3 additional refills.

## 2015-11-21 ENCOUNTER — Other Ambulatory Visit: Payer: Self-pay | Admitting: Family Medicine

## 2016-01-19 ENCOUNTER — Ambulatory Visit (INDEPENDENT_AMBULATORY_CARE_PROVIDER_SITE_OTHER): Payer: PPO | Admitting: Family Medicine

## 2016-01-19 ENCOUNTER — Ambulatory Visit (INDEPENDENT_AMBULATORY_CARE_PROVIDER_SITE_OTHER): Payer: PPO

## 2016-01-19 VITALS — BP 140/82 | HR 96 | Temp 98.2°F | Ht 69.0 in | Wt 196.2 lb

## 2016-01-19 DIAGNOSIS — I10 Essential (primary) hypertension: Secondary | ICD-10-CM

## 2016-01-19 DIAGNOSIS — R7303 Prediabetes: Secondary | ICD-10-CM

## 2016-01-19 DIAGNOSIS — Z0000 Encounter for general adult medical examination without abnormal findings: Secondary | ICD-10-CM | POA: Diagnosis not present

## 2016-01-19 DIAGNOSIS — Z125 Encounter for screening for malignant neoplasm of prostate: Secondary | ICD-10-CM | POA: Diagnosis not present

## 2016-01-19 DIAGNOSIS — G4733 Obstructive sleep apnea (adult) (pediatric): Secondary | ICD-10-CM

## 2016-01-19 DIAGNOSIS — N529 Male erectile dysfunction, unspecified: Secondary | ICD-10-CM | POA: Diagnosis not present

## 2016-01-19 MED ORDER — TADALAFIL 5 MG PO TABS: 5.0000 mg | ORAL_TABLET | Freq: Every day | ORAL | 5 refills | Status: DC

## 2016-01-19 MED ORDER — AMLODIPINE BESYLATE 5 MG PO TABS: 5.0000 mg | ORAL_TABLET | Freq: Every day | ORAL | 4 refills | Status: DC

## 2016-01-19 MED ORDER — METOPROLOL SUCCINATE ER 100 MG PO TB24: 100.0000 mg | ORAL_TABLET | Freq: Every day | ORAL | 4 refills | Status: DC

## 2016-01-19 NOTE — Progress Notes (Signed)
Patient: Alexander Espinoza Male    DOB: July 15, 1940   76 y.o.   MRN: [id number] Visit Date: 01/19/2016  Today's Provider: Lelon Huh, MD   Chief Complaint  Patient presents with  . Hypertension    follow up  . Sleep Apnea    follow up  . Hyperglycemia    follow up   Subjective:    HPI   Hypertension, follow-up:  BP Readings from Last 3 Encounters:  01/19/16 140/82  01/18/15 128/68  11/10/13 128/70    He was last seen for hypertension 1 years ago.  BP at that visit was 128/68. Management since that visit includes no changes. He reports good compliance with treatment. He is not having side effects.  He is not exercising. He is adherent to low salt diet.   Outside blood pressures are not being checked. He is experiencing none.  Patient denies none.   Cardiovascular risk factors include advanced age (older than 47 for men, 10 for women), hypertension and male gender.  Use of agents associated with hypertension: none.     Weight trend: decreasing steadily Wt Readings from Last 3 Encounters:  01/19/16 196 lb 3.2 oz (89 kg)  01/18/15 201 lb (91.2 kg)  11/10/13 208 lb (94.3 kg)    Current diet: well balanced  ------------------------------------------------------------------------  Prediabetes, Follow-up:   Lab Results  Component Value Date   HGBA1C 6.2 (H) 01/24/2015   HGBA1C 6.2 11/11/2013   GLUCOSE 121 (H) 01/24/2015    Last seen for for this1 years ago.  Management since that visit includes no changes. Current symptoms include none and have been stable.  Weight trend: decreasing steadily Prior visit with dietician: no Current diet: well balanced Current exercise: none  Pertinent Labs:    Component Value Date/Time   CHOL 156 11/11/2013   TRIG 153 11/11/2013   CREATININE 1.17 01/24/2015 1056    Wt Readings from Last 3 Encounters:  01/19/16 196 lb 3.2 oz (89 kg)  01/18/15 201 lb (91.2 kg)  11/10/13 208 lb (94.3 kg)   Follow up  Obstructive sleep apnea:  Patient was last seen for this problem 1 year ago and no changes were made. During that visit, patient was doing well on CPAP. Today patient comes in reporting good compliance with treatment, good tolerance and good symptom control. Needs to CPAP mask as his old one has worn. He is using CPAP every night and feels much better starting it.      No Known Allergies   Current Outpatient Prescriptions:  .  acetaminophen (TYLENOL) 500 MG tablet, Take 2 tablets (1,000 mg total) by mouth every 6 (six) hours as needed for moderate pain., Disp: 30 tablet, Rfl: 0 .  amLODipine (NORVASC) 5 MG tablet, TAKE 1 TABLET BY MOUTH EVERY DAY, Disp: 30 tablet, Rfl: 3 .  bacitracin-neomycin-polymyxin-hydrocortisone (CORTISPORIN) 1 % ointment, Apply 1 application topically 2 (two) times daily. (Patient not taking: Reported on 01/19/2016), Disp: 15 g, Rfl: 0 .  losartan-hydrochlorothiazide (HYZAAR) 100-25 MG tablet, TAKE 1 TABLET BY MOUTH DAILY, Disp: 90 tablet, Rfl: 4 .  metoprolol succinate (TOPROL-XL) 100 MG 24 hr tablet, Take 1 tablet (100 mg total) by mouth daily., Disp: 30 tablet, Rfl: 3 .  tadalafil (CIALIS) 5 MG tablet, Take 1 tablet (5 mg total) by mouth daily., Disp: 10 tablet, Rfl: 1  Review of Systems  Constitutional: Negative for appetite change, chills and fever.  HENT: Positive for hearing loss.   Respiratory: Negative for  chest tightness, shortness of breath and wheezing.   Cardiovascular: Negative for chest pain and palpitations.  Gastrointestinal: Negative for abdominal pain, nausea and vomiting.  Neurological: Positive for numbness.    Social History  Substance Use Topics  . Smoking status: Former Smoker    Packs/day: 1.00    Years: 13.00    Types: Cigarettes    Quit date: 01/09/1968  . Smokeless tobacco: Never Used  . Alcohol use 0.0 oz/week     Comment: one beer every once in a while   Objective:    Vital Signs - Last Recorded  Most recent update: 01/19/2016  8:54 AM by [name], LPN  BP  [id number] (BP Location: Right Arm)     Pulse  96     Temp  98.2 F (36.8 C) (Oral)     Ht  5\' 9"  (1.753 m)     Wt  196 lb 3.2 oz (89 kg)      BMI  28.97 kg/m       Physical Exam   General Appearance:    Alert, cooperative, no distress  Eyes:    PERRL, conjunctiva/corneas clear, EOM's intact       Lungs:     Clear to auscultation bilaterally, respirations unlabored  Heart:    Regular rate and rhythm  Neurologic:   Awake, alert, oriented x 3. No apparent focal neurological           defect.   MS:   Tender at insertion of right lateral deltoid. No swelling. Full passive ROM.    f    Assessment & Plan:     1. Essential hypertension Well controlled.  Continue current medications.   - amLODipine (NORVASC) 5 MG tablet; Take 1 tablet (5 mg total) by mouth daily.  Dispense: 90 tablet; Refill: 4 - metoprolol succinate (TOPROL-XL) 100 MG 24 hr tablet; Take 1 tablet (100 mg total) by mouth daily.  Dispense: 90 tablet; Refill: 4 - Lipid panel - Renal function panel - EKG 12-Lead  2. Obstructive sleep apnea Doing very well and is compliant with CPAP. Rx for new mask written today.   3. Prediabetes  - Renal function panel - Hemoglobin A1c  4. Prostate cancer screening  - PSA  5. Erectile dysfunction, unspecified erectile dysfunction type He states daily Cialis worked well for him and he requests new prescription.  - tadalafil (CIALIS) 5 MG tablet; Take 1 tablet (5 mg total) by mouth daily.  Dispense: 30 tablet; Refill: [place], MD  [facility]

## 2016-01-19 NOTE — Progress Notes (Signed)
Subjective:   Alexander Espinoza is a 76 y.o. male who presents for Medicare Annual/Subsequent preventive examination.  Review of Systems:  N/A  Cardiac Risk Factors include: advanced age (>28men, >29 women);hypertension;male gender     Objective:    Vitals: BP 140/82 (BP Location: Right Arm)   Pulse 96   Temp 98.2 F (36.8 C) (Oral)   Ht 5\' 9"  (1.753 m)   Wt 196 lb 3.2 oz (89 kg)   BMI 28.97 kg/m   Body mass index is 28.97 kg/m.  Tobacco History  Smoking Status  . Former Smoker  . Packs/day: 1.00  . Years: 13.00  . Types: Cigarettes  . Quit date: 01/09/1968  Smokeless Tobacco  . Never Used     Counseling given: Not Answered   Past Medical History:  Diagnosis Date  . ED (erectile dysfunction)   . History of mumps   . Hypertension   . OSA (obstructive sleep apnea)   . Prediabetes   . Restless leg    Past Surgical History:  Procedure Laterality Date  . Sleep study  04/24/2013   AHI=15.9, RDI=17.1 on MSPG   Family History  Problem Relation Age of Onset  . Prostate cancer Father   . Stroke Sister   . Parkinsonism Maternal Uncle    History  Sexual Activity  . Sexual activity: Not on file    Outpatient Encounter Prescriptions as of 01/19/2016  Medication Sig  . acetaminophen (TYLENOL) 500 MG tablet Take 2 tablets (1,000 mg total) by mouth every 6 (six) hours as needed for moderate pain.  Marland Kitchen amLODipine (NORVASC) 5 MG tablet TAKE 1 TABLET BY MOUTH EVERY DAY  . losartan-hydrochlorothiazide (HYZAAR) 100-25 MG tablet TAKE 1 TABLET BY MOUTH DAILY  . metoprolol succinate (TOPROL-XL) 100 MG 24 hr tablet Take 1 tablet (100 mg total) by mouth daily.  . tadalafil (CIALIS) 5 MG tablet Take 1 tablet (5 mg total) by mouth daily.  . bacitracin-neomycin-polymyxin-hydrocortisone (CORTISPORIN) 1 % ointment Apply 1 application topically 2 (two) times daily. (Patient not taking: Reported on 01/19/2016)   No facility-administered encounter medications on file as of 01/19/2016.       Activities of Daily Living In your present state of health, do you have any difficulty performing the following activities: 01/19/2016  Hearing? Y  Vision? N  Difficulty concentrating or making decisions? N  Walking or climbing stairs? N  Dressing or bathing? N  Doing errands, shopping? N  Preparing Food and eating ? N  Using the Toilet? N  In the past six months, have you accidently leaked urine? N  Do you have problems with loss of bowel control? N  Managing your Medications? N  Managing your Finances? N  Housekeeping or managing your Housekeeping? N  Some recent data might be hidden    Patient Care Team: Birdie Sons, MD as PCP - General (Family Medicine) Odette Fraction as Consulting Physician (Optometry)   Assessment:     Exercise Activities and Dietary recommendations Current Exercise Habits: The patient does not participate in regular exercise at present, Exercise limited by: None identified  Goals    . Increase water intake          Starting 01/19/16, I will increase my water intake to 6 glasses a day.      Fall Risk Fall Risk  01/19/2016 01/18/2015  Falls in the past year? No No   Depression Screen PHQ 2/9 Scores 01/19/2016 01/18/2015  PHQ - 2 Score 0 0  Cognitive Function     6CIT Screen 01/19/2016  What Year? 0 points  What month? 0 points  What time? 0 points  Count back from 20 0 points  Months in reverse 0 points  Repeat phrase 6 points  Total Score 6    Immunization History  Administered Date(s) Administered  . Influenza-Unspecified 11/03/2014  . Pneumococcal Conjugate-13 04/23/2013  . Pneumococcal Polysaccharide-23 01/11/2011   Screening Tests Health Maintenance  Topic Date Due  . ZOSTAVAX  01/08/2017 (Originally 12/31/2002)  . COLONOSCOPY  05/17/2022  . TETANUS/TDAP  11/05/2022  . INFLUENZA VACCINE  Completed  . PNA vac Low Risk Adult  Completed      Plan:  I have personally reviewed and addressed the Medicare Annual  Wellness questionnaire and have noted the following in the patient's chart:  A. Medical and social history B. Use of alcohol, tobacco or illicit drugs  C. Current medications and supplements D. Functional ability and status E.  Nutritional status F.  Physical activity G. Advance directives H. List of other physicians I.  Hospitalizations, surgeries, and ER visits in previous 12 months J.  [place] such as hearing and vision if needed, cognitive and depression L. Referrals and appointments - none  In addition, I have reviewed and discussed with patient certain preventive protocols, quality metrics, and best practice recommendations. A written personalized care plan for preventive services as well as general preventive health recommendations were provided to patient.  See attached scanned questionnaire for additional information.   Signed,  [name], LPN Nurse Health Advisor   MD Recommendations: None.

## 2016-01-19 NOTE — Patient Instructions (Signed)
Mr. Koetting , Thank you for taking time to come for your Medicare Wellness Visit. I appreciate your ongoing commitment to your health goals. Please review the following plan we discussed and let me know if I can assist you in the future.   These are the goals we discussed: Goals    . Increase water intake          Starting 01/19/16, I will increase my water intake to 6 glasses a day.       This is a list of the screening recommended for you and due dates:  Health Maintenance  Topic Date Due  . Shingles Vaccine  01/08/2017*  . Colon Cancer Screening  05/17/2022  . Tetanus Vaccine  11/05/2022  . Flu Shot  Completed  . Pneumonia vaccines  Completed  *Topic was postponed. The date shown is not the original due date.   Preventive Care for Adults  A healthy lifestyle and preventive care can promote health and wellness. Preventive health guidelines for adults include the following key practices.  . A routine yearly physical is a good way to check with your health care provider about your health and preventive screening. It is a chance to share any concerns and updates on your health and to receive a thorough exam.  . Visit your dentist for a routine exam and preventive care every 6 months. Brush your teeth twice a day and floss once a day. Good oral hygiene prevents tooth decay and gum disease.  . The frequency of eye exams is based on your age, health, family medical history, use  of contact lenses, and other factors. Follow your health care provider's ecommendations for frequency of eye exams.  . Eat a healthy diet. Foods like vegetables, fruits, whole grains, low-fat dairy products, and lean protein foods contain the nutrients you need without too many calories. Decrease your intake of foods high in solid fats, added sugars, and salt. Eat the right amount of calories for you. Get information about a proper diet from your health care provider, if necessary.  . Regular physical exercise is  one of the most important things you can do for your health. Most adults should get at least 150 minutes of moderate-intensity exercise (any activity that increases your heart rate and causes you to sweat) each week. In addition, most adults need muscle-strengthening exercises on 2 or more days a week.  Silver Sneakers may be a benefit available to you. To determine eligibility, you may visit the website: www.silversneakers.com or contact program at 606-403-8128 Mon-Fri between 8AM-8PM.   . Maintain a healthy weight. The body mass index (BMI) is a screening tool to identify possible weight problems. It provides an estimate of body fat based on height and weight. Your health care provider can find your BMI and can help you achieve or maintain a healthy weight.   For adults 20 years and older: ? A BMI below 18.5 is considered underweight. ? A BMI of 18.5 to 24.9 is normal. ? A BMI of 25 to 29.9 is considered overweight. ? A BMI of 30 and above is considered obese.   . Maintain normal blood lipids and cholesterol levels by exercising and minimizing your intake of saturated fat. Eat a balanced diet with plenty of fruit and vegetables. Blood tests for lipids and cholesterol should begin at age 19 and be repeated every 5 years. If your lipid or cholesterol levels are high, you are over 50, or you are at high risk for heart  disease, you may need your cholesterol levels checked more frequently. Ongoing high lipid and cholesterol levels should be treated with medicines if diet and exercise are not working.  . If you smoke, find out from your health care provider how to quit. If you do not use tobacco, please do not start.  . If you choose to drink alcohol, please do not consume more than 2 drinks per day. One drink is considered to be 12 ounces (355 mL) of beer, 5 ounces (148 mL) of wine, or 1.5 ounces (44 mL) of liquor.  . If you are 81-42 years old, ask your health care provider if you should take  aspirin to prevent strokes.  . Use sunscreen. Apply sunscreen liberally and repeatedly throughout the day. You should seek shade when your shadow is shorter than you. Protect yourself by wearing long sleeves, pants, a wide-brimmed hat, and sunglasses year round, whenever you are outdoors.  . Once a month, do a whole body skin exam, using a mirror to look at the skin on your back. Tell your health care provider of new moles, moles that have irregular borders, moles that are larger than a pencil eraser, or moles that have changed in shape or color.

## 2016-01-23 DIAGNOSIS — Z125 Encounter for screening for malignant neoplasm of prostate: Secondary | ICD-10-CM | POA: Diagnosis not present

## 2016-01-23 DIAGNOSIS — R7303 Prediabetes: Secondary | ICD-10-CM | POA: Diagnosis not present

## 2016-01-23 DIAGNOSIS — I10 Essential (primary) hypertension: Secondary | ICD-10-CM | POA: Diagnosis not present

## 2016-01-24 ENCOUNTER — Encounter: Payer: Self-pay | Admitting: Family Medicine

## 2016-01-24 LAB — RENAL FUNCTION PANEL
Albumin: 4.4 g/dL (ref 3.5–4.8)
BUN/Creatinine Ratio: 13 (ref 10–24)
BUN: 14 mg/dL (ref 8–27)
CALCIUM: 9.5 mg/dL (ref 8.6–10.2)
CO2: 25 mmol/L (ref 18–29)
Chloride: 99 mmol/L (ref 96–106)
Creatinine, Ser: 1.12 mg/dL (ref 0.76–1.27)
GFR calc Af Amer: 75 mL/min/{1.73_m2} (ref >59)
GFR calc non Af Amer: 65 mL/min/{1.73_m2} (ref >59)
GLUCOSE: 142 mg/dL — AB (ref 65–99)
PHOSPHORUS: 3.1 mg/dL (ref 2.5–4.5)
POTASSIUM: 4.2 mmol/L (ref 3.5–5.2)
SODIUM: 142 mmol/L (ref 134–144)

## 2016-01-24 LAB — LIPID PANEL
Chol/HDL Ratio: 4 ratio units (ref 0.0–5.0)
Cholesterol, Total: 162 mg/dL (ref 100–199)
HDL: 41 mg/dL (ref >39)
LDL Calculated: 88 mg/dL (ref 0–99)
Triglycerides: 167 mg/dL — ABNORMAL HIGH (ref 0–149)
VLDL Cholesterol Cal: 33 mg/dL (ref 5–40)

## 2016-01-24 LAB — HEMOGLOBIN A1C
ESTIMATED AVERAGE GLUCOSE: 134 mg/dL
HEMOGLOBIN A1C: 6.3 % — AB (ref 4.8–5.6)

## 2016-01-24 LAB — PSA: PROSTATE SPECIFIC AG, SERUM: 0.3 ng/mL (ref 0.0–4.0)

## 2016-02-08 DIAGNOSIS — G4733 Obstructive sleep apnea (adult) (pediatric): Secondary | ICD-10-CM | POA: Diagnosis not present

## 2016-03-11 ENCOUNTER — Other Ambulatory Visit: Payer: Self-pay | Admitting: Family Medicine

## 2016-07-12 DIAGNOSIS — H35373 Puckering of macula, bilateral: Secondary | ICD-10-CM | POA: Diagnosis not present

## 2016-07-12 DIAGNOSIS — H40003 Preglaucoma, unspecified, bilateral: Secondary | ICD-10-CM | POA: Diagnosis not present

## 2016-07-12 DIAGNOSIS — H43813 Vitreous degeneration, bilateral: Secondary | ICD-10-CM | POA: Diagnosis not present

## 2016-07-12 DIAGNOSIS — H2513 Age-related nuclear cataract, bilateral: Secondary | ICD-10-CM | POA: Diagnosis not present

## 2016-07-25 DIAGNOSIS — L821 Other seborrheic keratosis: Secondary | ICD-10-CM | POA: Diagnosis not present

## 2016-07-25 DIAGNOSIS — Z86018 Personal history of other benign neoplasm: Secondary | ICD-10-CM | POA: Diagnosis not present

## 2016-07-25 DIAGNOSIS — L570 Actinic keratosis: Secondary | ICD-10-CM | POA: Diagnosis not present

## 2016-07-25 DIAGNOSIS — Z872 Personal history of diseases of the skin and subcutaneous tissue: Secondary | ICD-10-CM | POA: Diagnosis not present

## 2016-08-08 ENCOUNTER — Encounter: Payer: Self-pay | Admitting: Family Medicine

## 2016-08-08 ENCOUNTER — Ambulatory Visit (INDEPENDENT_AMBULATORY_CARE_PROVIDER_SITE_OTHER): Payer: PPO | Admitting: Family Medicine

## 2016-08-08 VITALS — BP 136/82 | Temp 98.2°F | Resp 16 | Wt 198.0 lb

## 2016-08-08 DIAGNOSIS — I10 Essential (primary) hypertension: Secondary | ICD-10-CM

## 2016-08-08 DIAGNOSIS — M7551 Bursitis of right shoulder: Secondary | ICD-10-CM

## 2016-08-08 MED ORDER — METHYLPREDNISOLONE ACETATE 80 MG/ML IJ SUSP: 80.0000 mg | Freq: Once | INTRAMUSCULAR | Status: DC

## 2016-08-08 NOTE — Progress Notes (Signed)
Patient: Alexander Espinoza Male    DOB: May 21, 1940   76 y.o.   MRN: [id number] Visit Date: 08/08/2016  Today's Provider: Lelon Huh, MD   No chief complaint on file.  Subjective:    HPI  Hypertension, follow-up:  BP Readings from Last 3 Encounters:  08/08/16 136/82  01/19/16 140/82  01/18/15 128/68    He was last seen for hypertension 6 months ago.  BP at that visit was 140/82. Management since that visit includes no changes. He reports good compliance with treatment. He is not having side effects.  He is not exercising. He is adherent to low salt diet.   Outside blood pressures are not being checked. He is experiencing chest pain.  Patient denies exertional chest pressure/discomfort, lower extremity edema and palpitations.     Weight trend: stable Wt Readings from Last 3 Encounters:  08/08/16 198 lb (89.8 kg)  01/19/16 196 lb 3.2 oz (89 kg)  01/18/15 201 lb (91.2 kg)    Current diet: well balanced  Shoulder pain: Patient reports that he has had shoulder pain for the last month. He reports that he has had this before in the past, but it resolved on its own. He denies any injury. He reports that he has taken ibuprofen which helps a little.      No Known Allergies   Current Outpatient Prescriptions:  .  acetaminophen (TYLENOL) 500 MG tablet, Take 2 tablets (1,000 mg total) by mouth every 6 (six) hours as needed for moderate pain., Disp: 30 tablet, Rfl: 0 .  amLODipine (NORVASC) 5 MG tablet, Take 1 tablet (5 mg total) by mouth daily., Disp: 90 tablet, Rfl: 4 .  bacitracin-neomycin-polymyxin-hydrocortisone (CORTISPORIN) 1 % ointment, Apply 1 application topically 2 (two) times daily. (Patient not taking: Reported on 01/19/2016), Disp: 15 g, Rfl: 0 .  losartan-hydrochlorothiazide (HYZAAR) 100-25 MG tablet, TAKE 1 TABLET BY MOUTH DAILY, Disp: 90 tablet, Rfl: 12 .  metoprolol succinate (TOPROL-XL) 100 MG 24 hr tablet, Take 1 tablet (100 mg total) by mouth  daily., Disp: 90 tablet, Rfl: 4 .  tadalafil (CIALIS) 5 MG tablet, Take 1 tablet (5 mg total) by mouth daily., Disp: 30 tablet, Rfl: 5  Review of Systems  Constitutional: Negative for fatigue and unexpected weight change.  Respiratory: Negative for chest tightness and shortness of breath.   Cardiovascular: Negative for chest pain, palpitations and leg swelling.    Social History  Substance Use Topics  . Smoking status: Former Smoker    Packs/day: 1.00    Years: 13.00    Types: Cigarettes    Quit date: 01/09/1968  . Smokeless tobacco: Never Used  . Alcohol use 0.0 oz/week     Comment: one beer every once in a while   Objective:   BP 136/82 (BP Location: Left Arm, Patient Position: Sitting, Cuff Size: Normal)   Temp 98.2 F (36.8 C)   Resp 16   Wt 198 lb (89.8 kg)   BMI 29.24 kg/m     Physical Exam   General Appearance:    Alert, cooperative, no distress  Eyes:    PERRL, conjunctiva/corneas clear, EOM's intact       Lungs:     Clear to auscultation bilaterally, respirations unlabored  Heart:    Regular rate and rhythm  MS:   Tender right posterior shoulder above spine of scapula FROM with pain at limits of shoulder rotation.           Assessment & Plan:  1. Bursitis of right shoulder Prepped with isopropyl alcohol and iodine. Injected.  - methylPREDNISolone acetate (DEPO-MEDROL) injection 80 mg; Inject 1 mL (80 mg total) into the muscle once. Call if symptoms change or if not rapidly improving.     2. Essential hypertension Well controlled.  Continue current medications.    Follow up in January as scheduled.        Lelon Huh, MD  [facility]

## 2016-08-22 DIAGNOSIS — H401131 Primary open-angle glaucoma, bilateral, mild stage: Secondary | ICD-10-CM | POA: Diagnosis not present

## 2016-09-25 DIAGNOSIS — H401131 Primary open-angle glaucoma, bilateral, mild stage: Secondary | ICD-10-CM | POA: Diagnosis not present

## 2017-01-22 ENCOUNTER — Ambulatory Visit (INDEPENDENT_AMBULATORY_CARE_PROVIDER_SITE_OTHER): Payer: PPO

## 2017-01-22 VITALS — BP 152/82 | HR 84 | Temp 98.8°F | Ht 69.0 in | Wt 194.8 lb

## 2017-01-22 DIAGNOSIS — Z0000 Encounter for general adult medical examination without abnormal findings: Secondary | ICD-10-CM

## 2017-01-22 NOTE — Patient Instructions (Signed)
Alexander Espinoza , Thank you for taking time to come for your Medicare Wellness Visit. I appreciate your ongoing commitment to your health goals. Please review the following plan we discussed and let me know if I can assist you in the future.   Screening recommendations/referrals: Colonoscopy: Up to date Recommended yearly ophthalmology/optometry visit for glaucoma screening and checkup Recommended yearly dental visit for hygiene and checkup  Vaccinations: Influenza vaccine: Up to date Pneumococcal vaccine: Up to date Tdap vaccine: Up to date Shingles vaccine: Pt declines today.     Advanced directives: Please bring a copy of your POA (Power of Attorney) and/or Living Will to your next appointment.   Conditions/risks identified: Recommend increasing water intake to 6 glasses or more a day.   Next appointment: 01/23/17 @ 10:00 AM  Preventive Care 75 Years and Older, Male Preventive care refers to lifestyle choices and visits with your health care provider that can promote health and wellness. What does preventive care include?  A yearly physical exam. This is also called an annual well check.  Dental exams once or twice a year.  Routine eye exams. Ask your health care provider how often you should have your eyes checked.  Personal lifestyle choices, including:  Daily care of your teeth and gums.  Regular physical activity.  Eating a healthy diet.  Avoiding tobacco and drug use.  Limiting alcohol use.  Practicing safe sex.  Taking low doses of aspirin every day.  Taking vitamin and mineral supplements as recommended by your health care provider. What happens during an annual well check? The services and screenings done by your health care provider during your annual well check will depend on your age, overall health, lifestyle risk factors, and family history of disease. Counseling  Your health care provider may ask you questions about your:  Alcohol use.  Tobacco  use.  Drug use.  Emotional well-being.  Home and relationship well-being.  Sexual activity.  Eating habits.  History of falls.  Memory and ability to understand (cognition).  Work and work Statistician. Screening  You may have the following tests or measurements:  Height, weight, and BMI.  Blood pressure.  Lipid and cholesterol levels. These may be checked every 5 years, or more frequently if you are over 40 years old.  Skin check.  Lung cancer screening. You may have this screening every year starting at age 60 if you have a 30-pack-year history of smoking and currently smoke or have quit within the past 15 years.  Fecal occult blood test (FOBT) of the stool. You may have this test every year starting at age 68.  Flexible sigmoidoscopy or colonoscopy. You may have a sigmoidoscopy every 5 years or a colonoscopy every 10 years starting at age 51.  Prostate cancer screening. Recommendations will vary depending on your family history and other risks.  Hepatitis C blood test.  Hepatitis B blood test.  Sexually transmitted disease (STD) testing.  Diabetes screening. This is done by checking your blood sugar (glucose) after you have not eaten for a while (fasting). You may have this done every 1-3 years.  Abdominal aortic aneurysm (AAA) screening. You may need this if you are a current or former smoker.  Osteoporosis. You may be screened starting at age 39 if you are at high risk. Talk with your health care provider about your test results, treatment options, and if necessary, the need for more tests. Vaccines  Your health care provider may recommend certain vaccines, such as:  Influenza  vaccine. This is recommended every year.  Tetanus, diphtheria, and acellular pertussis (Tdap, Td) vaccine. You may need a Td booster every 10 years.  Zoster vaccine. You may need this after age 16.  Pneumococcal 13-valent conjugate (PCV13) vaccine. One dose is recommended after age  36.  Pneumococcal polysaccharide (PPSV23) vaccine. One dose is recommended after age 46. Talk to your health care provider about which screenings and vaccines you need and how often you need them. This information is not intended to replace advice given to you by your health care provider. Make sure you discuss any questions you have with your health care provider. Document Released: 01/21/2015 Document Revised: 09/14/2015 Document Reviewed: 10/26/2014 Elsevier Interactive Patient Education  2017 [place] Prevention in the Home Falls can cause injuries. They can happen to people of all ages. There are many things you can do to make your home safe and to help prevent falls. What can I do on the outside of my home?  Regularly fix the edges of walkways and driveways and fix any cracks.  Remove anything that might make you trip as you walk through a door, such as a raised step or threshold.  Trim any bushes or trees on the path to your home.  Use bright outdoor lighting.  Clear any walking paths of anything that might make someone trip, such as rocks or tools.  Regularly check to see if handrails are loose or broken. Make sure that both sides of any steps have handrails.  Any raised decks and porches should have guardrails on the edges.  Have any leaves, snow, or ice cleared regularly.  Use sand or salt on walking paths during winter.  Clean up any spills in your garage right away. This includes oil or grease spills. What can I do in the bathroom?  Use night lights.  Install grab bars by the toilet and in the tub and shower. Do not use towel bars as grab bars.  Use non-skid mats or decals in the tub or shower.  If you need to sit down in the shower, use a plastic, non-slip stool.  Keep the floor dry. Clean up any water that spills on the floor as soon as it happens.  Remove soap buildup in the tub or shower regularly.  Attach bath mats securely with double-sided  non-slip rug tape.  Do not have throw rugs and other things on the floor that can make you trip. What can I do in the bedroom?  Use night lights.  Make sure that you have a light by your bed that is easy to reach.  Do not use any sheets or blankets that are too big for your bed. They should not hang down onto the floor.  Have a firm chair that has side arms. You can use this for support while you get dressed.  Do not have throw rugs and other things on the floor that can make you trip. What can I do in the kitchen?  Clean up any spills right away.  Avoid walking on wet floors.  Keep items that you use a lot in easy-to-reach places.  If you need to reach something above you, use a strong step stool that has a grab bar.  Keep electrical cords out of the way.  Do not use floor polish or wax that makes floors slippery. If you must use wax, use non-skid floor wax.  Do not have throw rugs and other things on the floor that can  make you trip. What can I do with my stairs?  Do not leave any items on the stairs.  Make sure that there are handrails on both sides of the stairs and use them. Fix handrails that are broken or loose. Make sure that handrails are as long as the stairways.  Check any carpeting to make sure that it is firmly attached to the stairs. Fix any carpet that is loose or worn.  Avoid having throw rugs at the top or bottom of the stairs. If you do have throw rugs, attach them to the floor with carpet tape.  Make sure that you have a light switch at the top of the stairs and the bottom of the stairs. If you do not have them, ask someone to add them for you. What else can I do to help prevent falls?  Wear shoes that:  Do not have high heels.  Have rubber bottoms.  Are comfortable and fit you well.  Are closed at the toe. Do not wear sandals.  If you use a stepladder:  Make sure that it is fully opened. Do not climb a closed stepladder.  Make sure that both  sides of the stepladder are locked into place.  Ask someone to hold it for you, if possible.  Clearly mark and make sure that you can see:  Any grab bars or handrails.  First and last steps.  Where the edge of each step is.  Use tools that help you move around (mobility aids) if they are needed. These include:  Canes.  Walkers.  Scooters.  Crutches.  Turn on the lights when you go into a dark area. Replace any light bulbs as soon as they burn out.  Set up your furniture so you have a clear path. Avoid moving your furniture around.  If any of your floors are uneven, fix them.  If there are any pets around you, be aware of where they are.  Review your medicines with your doctor. Some medicines can make you feel dizzy. This can increase your chance of falling. Ask your doctor what other things that you can do to help prevent falls. This information is not intended to replace advice given to you by your health care provider. Make sure you discuss any questions you have with your health care provider. Document Released: 10/21/2008 Document Revised: 06/02/2015 Document Reviewed: 01/29/2014 Elsevier Interactive Patient Education  2017 [employer].

## 2017-01-22 NOTE — Progress Notes (Signed)
Subjective:   Alexander Espinoza is a 77 y.o. male who presents for Medicare Annual/Subsequent preventive examination.  Review of Systems:  N/A  Cardiac Risk Factors include: advanced age (>33men, >75 women);male gender;hypertension     Objective:    Vitals: BP (!) 152/82 (BP Location: Right Arm)   Pulse 84   Temp 98.8 F (37.1 C) (Oral)   Ht 5\' 9"  (1.753 m)   Wt 194 lb 12.8 oz (88.4 kg)   BMI 28.77 kg/m   Body mass index is 28.77 kg/m.  Advanced Directives 01/22/2017 01/19/2016  Does Patient Have a Medical Advance Directive? Yes Yes  Type of Paramedic of [place];Living will [place];Living will  Copy of [place] in Chart? No - copy requested No - copy requested    Tobacco Social History   Tobacco Use  Smoking Status Former Smoker  . Packs/day: 1.00  . Years: 13.00  . Pack years: 13.00  . Types: Cigarettes  . Last attempt to quit: 01/09/1968  . Years since quitting: 49.0  Smokeless Tobacco Never Used     Counseling given: Not Answered   Clinical Intake:  Pre-visit preparation completed: Yes  Pain : No/denies pain Pain Score: 0-No pain     Nutritional Status: BMI 25 -29 Overweight Nutritional Risks: None Diabetes: No  How often do you need to have someone help you when you read instructions, pamphlets, or other written materials from your doctor or pharmacy?: 1 - Never  Interpreter Needed?: No  Information entered by :: [facility], LPN  Past Medical History:  Diagnosis Date  . ED (erectile dysfunction)   . History of mumps   . Hypertension   . OSA (obstructive sleep apnea)   . Prediabetes   . Restless leg    Past Surgical History:  Procedure Laterality Date  . Sleep study  04/24/2013   AHI=15.9, RDI=17.1 on MSPG   Family History  Problem Relation Age of Onset  . Prostate cancer Father   . Stroke Sister   . Parkinsonism Maternal Uncle    Social History   Socioeconomic  History  . Marital status: Married    Spouse name: None  . Number of children: 2  . Years of education: None  . Highest education level: Some college, no degree  Social Needs  . Financial resource strain: Not hard at all  . Food insecurity - worry: Never true  . Food insecurity - inability: Never true  . Transportation needs - medical: No  . Transportation needs - non-medical: No  Occupational History  . Occupation: retired  Tobacco Use  . Smoking status: Former Smoker    Packs/day: 1.00    Years: 13.00    Pack years: 13.00    Types: Cigarettes    Last attempt to quit: 01/09/1968    Years since quitting: 49.0  . Smokeless tobacco: Never Used  Substance and Sexual Activity  . Alcohol use: Yes    Alcohol/week: 0.0 oz    Comment: one beer every once in a while  . Drug use: No  . Sexual activity: None  Other Topics Concern  . None  Social History Narrative  . None    Outpatient Encounter Medications as of 01/22/2017  Medication Sig  . acetaminophen (TYLENOL) 500 MG tablet Take 2 tablets (1,000 mg total) by mouth every 6 (six) hours as needed for moderate pain.  Marland Kitchen amLODipine (NORVASC) 5 MG tablet Take 1 tablet (5 mg total) by mouth  daily.  . losartan-hydrochlorothiazide (HYZAAR) 100-25 MG tablet TAKE 1 TABLET BY MOUTH DAILY  . metoprolol succinate (TOPROL-XL) 100 MG 24 hr tablet Take 1 tablet (100 mg total) by mouth daily.  . tadalafil (CIALIS) 5 MG tablet Take 1 tablet (5 mg total) by mouth daily.  . [DISCONTINUED] bacitracin-neomycin-polymyxin-hydrocortisone (CORTISPORIN) 1 % ointment Apply 1 application topically 2 (two) times daily. (Patient not taking: Reported on 01/19/2016)   Facility-Administered Encounter Medications as of 01/22/2017  Medication  . methylPREDNISolone acetate (DEPO-MEDROL) injection 80 mg    Activities of Daily Living In your present state of health, do you have any difficulty performing the following activities: 01/22/2017  Hearing? 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 N  Difficulty concentrating or making decisions? N  Walking or climbing stairs? N  Dressing or bathing? N  Doing errands, shopping? N  Preparing Food and eating ? N  Using the Toilet? N  In the past six months, have you accidently leaked urine? N  Do you have problems with loss of bowel control? N  Managing your Medications? N  Managing your Finances? N  Housekeeping or managing your Housekeeping? N  Some recent data might be hidden    Patient Care Team: Birdie Sons, MD as PCP - General (Family Medicine) Odette Fraction as Consulting Physician (Optometry) Jannet Mantis, MD as Consulting Physician (Dermatology)   Assessment:   This is a routine wellness examination for Alexander Espinoza.  Exercise Activities and Dietary recommendations Current Exercise Habits: Home exercise routine, Type of exercise: stretching;Other - see comments(sit ups), Time (Minutes): 10, Frequency (Times/Week): 2, Weekly Exercise (Minutes/Week): 20, Intensity: Mild, Exercise limited by: Other - see comments(busy taking care of mother in law)  Goals    . DIET - INCREASE WATER INTAKE     Recommend increasing water intake to 6 glasses or more a day.        Fall Risk Fall Risk  01/22/2017 01/19/2016 01/18/2015  Falls in the past year? No No No   Is the patient's home free of loose throw rugs in walkways, pet beds, electrical cords, etc?   yes      Grab bars in the bathroom? no      Handrails on the stairs?  N/A      Adequate lighting?   yes  Timed Get Up and Go Performed: N/A  Depression Screen PHQ 2/9 Scores 01/22/2017 01/22/2017 01/19/2016 01/18/2015  PHQ - 2 Score 0 0 0 0  PHQ- 9 Score 0 - - -    Cognitive Function: Pt declined screening today.     6CIT Screen 01/19/2016  What Year? 0 points  What month? 0 points  What time? 0 points  Count back from 20 0 points  Months in reverse 0 points  Repeat phrase 6 points  Total Score 6     Immunization History  Administered Date(s) Administered  . Influenza-Unspecified 11/03/2014  . Pneumococcal Conjugate-13 04/23/2013  . Pneumococcal Polysaccharide-23 01/11/2011, 01/11/2012    Qualifies for Shingles Vaccine? Due for Shingles vaccine. Declined my offer to administer today. Education has been provided regarding the importance of this vaccine. Pt has been advised to call her insurance company to determine her out of pocket expense. Advised she may also receive this vaccine at her local pharmacy or Health Dept. Verbalized acceptance and understanding.  Screening Tests Health Maintenance  Topic Date Due  . COLONOSCOPY  05/17/2022  . TETANUS/TDAP  11/05/2022  . INFLUENZA VACCINE  Completed  .  PNA vac Low Risk Adult  Completed   Cancer Screenings: Lung: Low Dose CT Chest recommended if Age 76-80 years, 30 pack-year currently smoking OR have quit w/in 15years. Patient does not qualify. Colorectal: Up to date  Additional Screenings:  Hepatitis B/HIV/Syphillis: Pt declines today.  Hepatitis C Screening: Pt declines today.     Plan:  I have personally reviewed and addressed the Medicare Annual Wellness questionnaire and have noted the following in the patient's chart:  A. Medical and social history B. Use of alcohol, tobacco or illicit drugs  C. Current medications and supplements D. Functional ability and status E.  Nutritional status F.  Physical activity G. Advance directives H. List of other physicians I.  Hospitalizations, surgeries, and ER visits in previous 12 months J.  [place] such as hearing and vision if needed, cognitive and depression L. Referrals and appointments - none  In addition, I have reviewed and discussed with patient certain preventive protocols, quality metrics, and best practice recommendations. A written personalized care plan for preventive services as well as general preventive health recommendations were provided to  patient.  See attached scanned questionnaire for additional information.   Signed,  [name], LPN Nurse Health Advisor   Nurse Recommendations: None.

## 2017-01-23 ENCOUNTER — Other Ambulatory Visit: Payer: Self-pay | Admitting: Family Medicine

## 2017-01-23 ENCOUNTER — Ambulatory Visit (INDEPENDENT_AMBULATORY_CARE_PROVIDER_SITE_OTHER): Payer: PPO | Admitting: Family Medicine

## 2017-01-23 ENCOUNTER — Encounter: Payer: Self-pay | Admitting: Family Medicine

## 2017-01-23 ENCOUNTER — Telehealth: Payer: Self-pay | Admitting: Family Medicine

## 2017-01-23 VITALS — BP 158/88 | HR 80 | Temp 98.0°F | Resp 16 | Ht 69.0 in | Wt 195.0 lb

## 2017-01-23 DIAGNOSIS — Z0000 Encounter for general adult medical examination without abnormal findings: Secondary | ICD-10-CM

## 2017-01-23 DIAGNOSIS — R7303 Prediabetes: Secondary | ICD-10-CM | POA: Diagnosis not present

## 2017-01-23 DIAGNOSIS — Z125 Encounter for screening for malignant neoplasm of prostate: Secondary | ICD-10-CM

## 2017-01-23 DIAGNOSIS — I10 Essential (primary) hypertension: Secondary | ICD-10-CM

## 2017-01-23 DIAGNOSIS — Q254 Congenital malformation of aorta unspecified: Secondary | ICD-10-CM | POA: Diagnosis not present

## 2017-01-23 DIAGNOSIS — Z87891 Personal history of nicotine dependence: Secondary | ICD-10-CM

## 2017-01-23 DIAGNOSIS — L219 Seborrheic dermatitis, unspecified: Secondary | ICD-10-CM

## 2017-01-23 DIAGNOSIS — Z0001 Encounter for general adult medical examination with abnormal findings: Secondary | ICD-10-CM

## 2017-01-23 NOTE — Telephone Encounter (Signed)
Per scheduling department the aorta ultrasound that was ordered needs to be changed to ultrasound aorta screen medicare.[id number]

## 2017-01-23 NOTE — Patient Instructions (Signed)
   The CDC recommends two doses of Shingrix (the shingles vaccine) separated by 2 to 6 months for adults age 75 years and older. I recommend checking with your insurance plan regarding coverage for this vaccine.    Try drops of sweet oil for itching of ear. If not effective then call for prescription of cortisporin-hc ear drops.

## 2017-01-23 NOTE — Progress Notes (Signed)
Patient: Alexander Espinoza, Male    DOB: 01-20-1940, 77 y.o.   MRN: [id number] Visit Date: 01/23/2017  Today's Provider: Lelon Huh, MD   Chief Complaint  Patient presents with  . Annual Exam  . Hypertension    follow up  . Sleep Apnea    follow up  . Hyperglycemia    follow up   Subjective:    Annual physical exam Alexander Espinoza is a 77 y.o. male who presents today for health maintenance and complete physical. He feels well. He reports exercising weekly. He reports he is sleeping well.  -----------------------------------------------------------------  Hypertension, follow-up:  BP Readings from Last 3 Encounters:  01/22/17 (!) 152/82  08/08/16 136/82  01/19/16 140/82    He was last seen for hypertension 5 months ago.  BP at that visit was 136/82. Management since that visit includes no changes. He reports good compliance with treatment. He is not having side effects.  He is exercising. He is not adherent to low salt diet.   Outside blood pressures are not being checked. He is experiencing none.  Patient denies chest pain, chest pressure/discomfort, claudication, dyspnea, exertional chest pressure/discomfort, fatigue, irregular heart beat, lower extremity edema, near-syncope, orthopnea, palpitations, paroxysmal nocturnal dyspnea, syncope and tachypnea.   Cardiovascular risk factors include advanced age (older than 75 for men, 23 for women) and hypertension.  Use of agents associated with hypertension: none.     Weight trend: stable Wt Readings from Last 3 Encounters:  01/22/17 194 lb 12.8 oz (88.4 kg)  08/08/16 198 lb (89.8 kg)  01/19/16 196 lb 3.2 oz (89 kg)    Current diet: in general, a "healthy" diet    ------------------------------------------------------------------------ Follow up of Obstructive Sleep Apnea:  Patient was last seen for this problem 1 year ago. No changes were made during that visit. Patient was advised to continue using his  CPAP, and a new prescription was written for a mask. Today, patient reports fair compliance with CPAP, good tolerance and good symptom control. Patient states he uses the CPAP machine off and on. He hasn't used in the the last 3-4 days.      Prediabetes, Follow-up:   Lab Results  Component Value Date   HGBA1C 6.3 (H) 01/23/2016   HGBA1C 6.2 (H) 01/24/2015   HGBA1C 6.2 11/11/2013   GLUCOSE 142 (H) 01/23/2016   GLUCOSE 121 (H) 01/24/2015    Last seen for for this1 years ago.  Management since that visit includes no changes. Current symptoms include none and have been stable.  Weight trend: stable Prior visit with dietician: no Current diet: in general, a "healthy" diet   Current exercise: walking  Pertinent Labs:    Component Value Date/Time   CHOL 162 01/23/2016 0845   TRIG 167 (H) 01/23/2016 0845   CHOLHDL 4.0 01/23/2016 0845   CREATININE 1.12 01/23/2016 0845    Wt Readings from Last 3 Encounters:  01/22/17 194 lb 12.8 oz (88.4 kg)  08/08/16 198 lb (89.8 kg)  01/19/16 196 lb 3.2 oz (89 kg)   Complains of right ear feels dry and itchy. No change in hearing. No swelling or pain in year. No discharge.   Complains or aching in right hand pain 4-5 months, off and on, no known injuries. No clear triggers. Usually relieved with rest. No numbness or tingling.   Review of Systems  Constitutional: Negative for appetite change, chills, fatigue and fever.  HENT: Negative for congestion, ear pain, hearing loss, nosebleeds and  trouble swallowing.   Eyes: Negative for pain and visual disturbance.  Respiratory: Negative for cough, chest tightness and shortness of breath.   Cardiovascular: Negative for chest pain, palpitations and leg swelling.  Gastrointestinal: Negative for abdominal pain, blood in stool, constipation, diarrhea, nausea and vomiting.  Endocrine: Negative for polydipsia, polyphagia and polyuria.  Genitourinary: Negative for dysuria and flank pain.  Musculoskeletal:  Negative for arthralgias, back pain, joint swelling, myalgias and neck stiffness.  Skin: Negative for color change, rash and wound.  Neurological: Negative for dizziness, tremors, seizures, speech difficulty, weakness, light-headedness and headaches.  Psychiatric/Behavioral: Negative for behavioral problems, confusion, decreased concentration, dysphoric mood and sleep disturbance. The patient is not nervous/anxious.   All other systems reviewed and are negative.   Social History      He  reports that he quit smoking about 49 years ago. His smoking use included cigarettes. He has a 13.00 pack-year smoking history. he has never used smokeless tobacco. He reports that he drinks alcohol. He reports that he does not use drugs.       Social History   Socioeconomic History  . Marital status: Married    Spouse name: Not on file  . Number of children: 2  . Years of education: Not on file  . Highest education level: Some college, no degree  Social Needs  . Financial resource strain: Not hard at all  . Food insecurity - worry: Never true  . Food insecurity - inability: Never true  . Transportation needs - medical: No  . Transportation needs - non-medical: No  Occupational History  . Occupation: retired  Tobacco Use  . Smoking status: Former Smoker    Packs/day: 1.00    Years: 13.00    Pack years: 13.00    Types: Cigarettes    Last attempt to quit: 01/09/1968    Years since quitting: 49.0  . Smokeless tobacco: Never Used  Substance and Sexual Activity  . Alcohol use: Yes    Alcohol/week: 0.0 oz    Comment: one beer every once in a while  . Drug use: No  . Sexual activity: Not on file  Other Topics Concern  . Not on file  Social History Narrative  . Not on file    Past Medical History:  Diagnosis Date  . ED (erectile dysfunction)   . History of mumps   . Hypertension   . OSA (obstructive sleep apnea)   . Prediabetes   . Restless leg      Patient Active Problem List    Diagnosis Date Noted  . Bursitis of right shoulder 08/08/2016  . Erectile dysfunction 01/18/2015  . 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 Prediabetes 01/18/2015  . Restless leg 01/18/2015  . Sleep disturbance 01/18/2015  . Tremor 01/18/2015  . Obstructive sleep apnea 04/24/2013  . Hypertension 10/11/2011  . 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 Family history of malignant neoplasm of prostate 10/11/2011  . Proteinuria 10/11/2011   15 Past Surgical History:  Procedure Laterality Date  . Sleep study  04/24/2013   AHI=15.9, RDI=17.1 on [place] History        Family Status  Relation Name Status  . Mother  Deceased at age 46  . Father  Deceased at age 93       prostate cancer  . Sister  (Not Specified)  . Mat Uncle  (Not Specified)        His family  history includes Parkinsonism in his maternal uncle; Prostate cancer in his father; Stroke in his sister.     No Known Allergies   Current Outpatient Medications:  .  acetaminophen (TYLENOL) 500 MG tablet, Take 2 tablets (1,000 mg total) by mouth every 6 (six) hours as needed for moderate pain., Disp: 30 tablet, Rfl: 0 .  amLODipine (NORVASC) 5 MG tablet, Take 1 tablet (5 mg total) by mouth daily., Disp: 90 tablet, Rfl: 4 .  losartan-hydrochlorothiazide (HYZAAR) 100-25 MG tablet, TAKE 1 TABLET BY MOUTH DAILY, Disp: 90 tablet, Rfl: 12 .  metoprolol succinate (TOPROL-XL) 100 MG 24 hr tablet, Take 1 tablet (100 mg total) by mouth daily., Disp: 90 tablet, Rfl: 4 .  tadalafil (CIALIS) 5 MG tablet, Take 1 tablet (5 mg total) by mouth daily., Disp: 30 tablet, Rfl: 5  Current Facility-Administered Medications:  .  methylPREDNISolone acetate (DEPO-MEDROL) injection 80 mg, 80 mg, Intramuscular, Once, Fisher, Kirstie Peri, MD   Patient Care Team: Birdie Sons, MD as PCP - General (Family Medicine) Odette Fraction as Consulting Physician (Optometry) Aubery Lapping, Cori Razor, MD as Consulting  Physician (Dermatology)      Objective:   Vitals: BP (!) 158/88 (BP Location: Right Arm, Cuff Size: Large)   Pulse 80   Temp 98 F (36.7 C) (Oral)   Resp 16   Ht 5\' 9"  (1.753 m)   Wt 195 lb (88.5 kg)   SpO2 98% Comment: room air  BMI 28.80 kg/m    Vitals:   01/23/17 0951 01/23/17 0953  BP: (!) 148/80 (!) 158/88  Pulse: 80   Resp: 16   Temp: 98 F (36.7 C)   TempSrc: Oral   SpO2: 98%   Weight: 195 lb (88.5 kg)   Height: 5\' 9"  (1.753 m)      Physical Exam   General Appearance:    Alert, cooperative, no distress, appears stated age  Head:    Normocephalic, without obvious abnormality, atraumatic  Eyes:    PERRL, conjunctiva/corneas clear, EOM's intact, fundi    benign, both eyes       Ears:    Normal TM's both ears. No hearing of left ear. Right ear canal mildly dried and flaky.   Nose:   Nares normal, septum midline, mucosa normal, no drainage   or sinus tenderness  Throat:   Lips, mucosa, and tongue normal; teeth and gums normal  Neck:   Supple, symmetrical, trachea midline, no adenopathy;       thyroid:  No enlargement/tenderness/nodules; no carotid   bruit or JVD  Back:     Symmetric, no curvature, ROM normal, no CVA tenderness  Lungs:     Clear to auscultation bilaterally, respirations unlabored  Chest wall:    No tenderness or deformity  Heart:    Regular rate and rhythm, S1 and S2 normal, no murmur, rub   or gallop  Abdomen:     Soft, non-tender, bowel sounds active all four quadrants,    , no organomegaly. Faint aortic bruit, aorta easily palpated, pulsating, feels about 3-4 cm by palpation.   Genitalia:    deferred  Rectal:    deferred  Extremities:   Extremities normal, atraumatic, no cyanosis or edema  Pulses:   2+ and symmetric all extremities  Skin:   Skin color, texture, turgor normal, no rashes or lesions  Lymph nodes:   Cervical, supraclavicular, and axillary nodes normal  Neurologic:   CNII-XII intact. Normal strength, sensation and reflexes  throughout    Depression Screen PHQ 2/9 Scores 01/22/2017 01/22/2017 01/19/2016 01/18/2015  PHQ - 2 Score 0 0 0 0  PHQ- 9 Score 0 - - -      Assessment & Plan:     Routine Health Maintenance and Physical Exam  Exercise Activities and Dietary recommendations Goals    . DIET - INCREASE WATER INTAKE     Recommend increasing water intake to 6 glasses or more a day.        Immunization History  Administered Date(s) Administered  . Influenza-Unspecified 11/03/2014  . Pneumococcal Conjugate-13 04/23/2013  . Pneumococcal Polysaccharide-23 01/11/2011, 01/11/2012    Health Maintenance  Topic Date Due  . COLONOSCOPY  05/17/2022  . TETANUS/TDAP  11/05/2022  . INFLUENZA VACCINE  Completed  . PNA vac Low Risk Adult  Completed     Discussed health benefits of physical activity, and encouraged him to engage in regular exercise appropriate for his age and condition.    --------------------------------------------------------------------  1. Annual physical exam  - Lipid panel - Comprehensive metabolic panel - EKG 65-LDJT  2. Essential hypertension Well controlled.  Continue current medications.   - Lipid panel - Comprehensive metabolic panel - EKG 70-VXBL  3. Prediabetes Is mostly avoid sweets and starchy foods.  - Hemoglobin A1c  4. Prostate cancer screening  - PSA  5. Aortic anomaly Counseled of increase AAA - US Aorta; Future  6. Smoking history  - US Aorta; Future  7. Seborrheic dermatitis Offered prescription for cortisporin otic, he would like to try some sweet oil first.   Addressed several chronic and acute medical problems today requiring additional time in counseling and coordination of care. in addition to time spent on routine physical and health maintenance, at least 20 minutes of this visit were spent addressing these medical problems,     Lelon Huh, MD  [place]

## 2017-01-23 NOTE — Telephone Encounter (Signed)
Order in epic. 

## 2017-01-24 ENCOUNTER — Other Ambulatory Visit: Payer: Self-pay | Admitting: Family Medicine

## 2017-01-24 DIAGNOSIS — I10 Essential (primary) hypertension: Secondary | ICD-10-CM

## 2017-01-24 LAB — PSA: PROSTATE SPECIFIC AG, SERUM: 0.4 ng/mL (ref 0.0–4.0)

## 2017-01-24 LAB — COMPREHENSIVE METABOLIC PANEL
ALBUMIN: 4.6 g/dL (ref 3.5–4.8)
ALT: 20 IU/L (ref 0–44)
AST: 21 IU/L (ref 0–40)
Albumin/Globulin Ratio: 1.6 (ref 1.2–2.2)
Alkaline Phosphatase: 89 IU/L (ref 39–117)
BUN / CREAT RATIO: 12 (ref 10–24)
BUN: 12 mg/dL (ref 8–27)
Bilirubin Total: 0.6 mg/dL (ref 0.0–1.2)
CALCIUM: 9.6 mg/dL (ref 8.6–10.2)
CO2: 25 mmol/L (ref 20–29)
CREATININE: 1.04 mg/dL (ref 0.76–1.27)
Chloride: 97 mmol/L (ref 96–106)
GFR, EST AFRICAN AMERICAN: 81 mL/min/{1.73_m2} (ref >59)
GFR, EST NON AFRICAN AMERICAN: 70 mL/min/{1.73_m2} (ref >59)
GLUCOSE: 146 mg/dL — AB (ref 65–99)
Globulin, Total: 2.8 g/dL (ref 1.5–4.5)
Potassium: 3.8 mmol/L (ref 3.5–5.2)
Sodium: 142 mmol/L (ref 134–144)
TOTAL PROTEIN: 7.4 g/dL (ref 6.0–8.5)

## 2017-01-24 LAB — HEMOGLOBIN A1C
Est. average glucose Bld gHb Est-mCnc: 134 mg/dL
Hgb A1c MFr Bld: 6.3 % — ABNORMAL HIGH (ref 4.8–5.6)

## 2017-01-24 LAB — LIPID PANEL
CHOL/HDL RATIO: 4 ratio (ref 0.0–5.0)
Cholesterol, Total: 171 mg/dL (ref 100–199)
HDL: 43 mg/dL (ref >39)
LDL Calculated: 88 mg/dL (ref 0–99)
Triglycerides: 198 mg/dL — ABNORMAL HIGH (ref 0–149)
VLDL Cholesterol Cal: 40 mg/dL (ref 5–40)

## 2017-01-29 ENCOUNTER — Telehealth: Payer: Self-pay

## 2017-01-29 ENCOUNTER — Ambulatory Visit
Admission: RE | Admit: 2017-01-29 | Discharge: 2017-01-29 | Disposition: A | Discharge status: Home / Self Care | Payer: PPO | Source: Ambulatory Visit | Attending: Family Medicine | Admitting: Family Medicine

## 2017-01-29 DIAGNOSIS — Z136 Encounter for screening for cardiovascular disorders: Secondary | ICD-10-CM | POA: Diagnosis not present

## 2017-01-29 DIAGNOSIS — R0989 Other specified symptoms and signs involving the circulatory and respiratory systems: Secondary | ICD-10-CM | POA: Insufficient documentation

## 2017-01-29 DIAGNOSIS — Q254 Congenital malformation of aorta unspecified: Secondary | ICD-10-CM

## 2017-01-29 DIAGNOSIS — Z87891 Personal history of nicotine dependence: Secondary | ICD-10-CM | POA: Diagnosis not present

## 2017-01-29 NOTE — Telephone Encounter (Signed)
Patient advised as below.  

## 2017-01-29 NOTE — Telephone Encounter (Signed)
-----   Message from Birdie Sons, MD sent at 01/29/2017  1:19 PM EST ----- Aortic ultrasound is normal, no sign of aneurysm.

## 2017-02-26 DIAGNOSIS — H401131 Primary open-angle glaucoma, bilateral, mild stage: Secondary | ICD-10-CM | POA: Diagnosis not present

## 2017-06-13 ENCOUNTER — Other Ambulatory Visit: Payer: Self-pay | Admitting: Family Medicine

## 2017-07-23 DIAGNOSIS — H401131 Primary open-angle glaucoma, bilateral, mild stage: Secondary | ICD-10-CM | POA: Diagnosis not present

## 2018-01-22 ENCOUNTER — Telehealth: Payer: Self-pay

## 2018-01-22 NOTE — Telephone Encounter (Signed)
LM with wife to have pt CB to schedule his AWV. Last AWV completed 01/22/17. -MM

## 2018-02-03 NOTE — Telephone Encounter (Signed)
Spoke with wife who states they have a lot going on right now. Pt's mother in law fell and broke her hip and they are having to take care of her. Wife states she will have pt CB when he gets a chance. Gave her my direct # to call. -MM

## 2018-02-10 NOTE — Telephone Encounter (Signed)
Scheduled for 02/13/18 @ 1:20 PM.

## 2018-02-13 ENCOUNTER — Ambulatory Visit: Payer: PPO

## 2018-02-20 ENCOUNTER — Ambulatory Visit (INDEPENDENT_AMBULATORY_CARE_PROVIDER_SITE_OTHER): Payer: PPO

## 2018-02-20 VITALS — BP 140/70 | HR 74 | Temp 98.7°F | Ht 69.0 in | Wt 189.2 lb

## 2018-02-20 DIAGNOSIS — Z0000 Encounter for general adult medical examination without abnormal findings: Secondary | ICD-10-CM

## 2018-02-20 NOTE — Progress Notes (Addendum)
Subjective:   Alexander Espinoza is a 78 y.o. male who presents for Medicare Annual/Subsequent preventive examination.  Review of Systems:  N/A  Cardiac Risk Factors include: advanced age (>22men, >36 women);hypertension;male gender     Objective:    Vitals: BP 140/70 (BP Location: Right Arm)   Pulse 74   Temp 98.7 F (37.1 C) (Oral)   Ht 5\' 9"  (1.753 m)   Wt 189 lb 3.2 oz (85.8 kg)   BMI 27.94 kg/m   Body mass index is 27.94 kg/m.  Advanced Directives 02/20/2018 01/22/2017 01/19/2016  Does Patient Have a Medical Advance Directive? Yes Yes Yes  Type of Paramedic of [place];Living will [place];Living will [place];Living will  Copy of [place] in Chart? No - copy requested No - copy requested No - copy requested    Tobacco Social History   Tobacco Use  Smoking Status Former Smoker  . Packs/day: 1.00  . Years: 13.00  . Pack years: 13.00  . Types: Cigarettes  . Last attempt to quit: 01/09/1968  . Years since quitting: 50.1  Smokeless Tobacco Never Used     Counseling given: Not Answered   Clinical Intake:  Pre-visit preparation completed: Yes  Pain : No/denies pain Pain Score: 0-No pain     Nutritional Status: BMI 25 -29 Overweight Nutritional Risks: None Diabetes: No  How often do you need to have someone help you when you read instructions, pamphlets, or other written materials from your doctor or pharmacy?: 1 - Never  Interpreter Needed?: No  Information entered by :: [facility], LPN  Past Medical History:  Diagnosis Date  . ED (erectile dysfunction)   . History of mumps   . Hypertension   . OSA (obstructive sleep apnea)   . Prediabetes   . Restless leg    Past Surgical History:  Procedure Laterality Date  . Sleep study  04/24/2013   AHI=15.9, RDI=17.1 on [facility]   Family History  Problem Relation Age of Onset  . Prostate cancer Father   . Stroke Sister   .  Parkinsonism Maternal Uncle    Social History   Socioeconomic History  . Marital status: Married    Spouse name: Not on file  . Number of children: 2  . Years of education: Not on file  . Highest education level: Some college, no degree  Occupational History  . Occupation: retired  Scientific laboratory technician  . Financial resource strain: Not hard at all  . Food insecurity:    Worry: Never true    Inability: Never true  . Transportation needs:    Medical: No    Non-medical: No  Tobacco Use  . Smoking status: Former Smoker    Packs/day: 1.00    Years: 13.00    Pack years: 13.00    Types: Cigarettes    Last attempt to quit: 01/09/1968    Years since quitting: 50.1  . Smokeless tobacco: Never Used  Substance and Sexual Activity  . Alcohol use: Yes    Alcohol/week: 0.0 standard drinks    Comment: one beer every once in a while  . Drug use: No  . Sexual activity: Not on file  Lifestyle  . Physical activity:    Days per week: 0 days    Minutes per session: 0 min  . Stress: Only a little  Relationships  . Social connections:    Talks on phone: Patient refused    Gets together: Patient  refused    Attends religious service: Patient refused    Active member of club or organization: Patient refused    Attends meetings of clubs or organizations: Patient refused    Relationship status: Patient refused  Other Topics Concern  . Not on file  Social History Narrative  . Not on file    Outpatient Encounter Medications as of 02/20/2018  Medication Sig  . acetaminophen (TYLENOL) 500 MG tablet Take 2 tablets (1,000 mg total) by mouth every 6 (six) hours as needed for moderate pain.  Marland Kitchen amLODipine (NORVASC) 5 MG tablet TAKE 1 TABLET(5 MG) BY MOUTH DAILY  . latanoprost (XALATAN) 0.005 % ophthalmic solution INSTILL 1 DROP IN OU QHS  . losartan-hydrochlorothiazide (HYZAAR) 100-25 MG tablet TAKE 1 TABLET BY MOUTH DAILY  . metoprolol succinate (TOPROL-XL) 100 MG 24 hr tablet TAKE 1 TABLET BY MOUTH  DAILY  . tadalafil (CIALIS) 5 MG tablet Take 1 tablet (5 mg total) by mouth daily.   No facility-administered encounter medications on file as of 02/20/2018.     Activities of Daily Living In your present state of health, do you have any difficulty performing the following activities: 02/20/2018  Hearing? Y  Comment Loss of hearing in left ear. Does not wear hearing aids.   Vision? N  Difficulty concentrating or making decisions? N  Walking or climbing stairs? N  Dressing or bathing? N  Doing errands, shopping? N  Preparing Food and eating ? N  Using the Toilet? N  In the past six months, have you accidently leaked urine? N  Do you have problems with loss of bowel control? N  Managing your Medications? N  Managing your Finances? N  Housekeeping or managing your Housekeeping? N  Some recent data might be hidden    Patient Care Team: Birdie Sons, MD as PCP - General (Family Medicine) Odette Fraction as Consulting Physician (Optometry) Jannet Mantis, MD as Consulting Physician (Dermatology)   Assessment:   This is a routine wellness examination for Alexander Espinoza.  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 )  Goals    . DIET - INCREASE WATER INTAKE     Recommend increasing water intake to 6 glasses or more a day.     . Increase water intake     Starting 01/19/16, I will increase my water intake to 6 glasses a day.       Fall Risk Fall Risk  02/20/2018 01/22/2017 01/19/2016 01/18/2015  Falls in the past year? 0 No No No   FALL RISK PREVENTION PERTAINING TO THE HOME:  Any stairs in or around the home WITH handrails? No  Home free of loose throw rugs in walkways, pet beds, electrical cords, etc? Yes  Adequate lighting in your home to reduce risk of falls? Yes   ASSISTIVE DEVICES  UTILIZED TO PREVENT FALLS:  Life alert? No  Use of a cane, walker or w/c? No  Grab bars in the bathroom? No  Shower chair or bench in shower? No  Elevated toilet seat or a handicapped toilet? No    TIMED UP AND GO:  Was the test performed? No .     Depression Screen PHQ 2/9 Scores 02/20/2018 02/20/2018 01/22/2017 01/22/2017  PHQ - 2 Score 0 0 0 0  PHQ- 9 Score 0 - 0 -    Cognitive Function:  Declined today.      6CIT Screen 01/19/2016  What Year? 0 points  What month? 0 points  What time? 0 points  Count back from 20 0 points  Months in reverse 0 points  Repeat phrase 6 points  Total Score 6    Immunization History  Administered Date(s) Administered  . Influenza, High Dose Seasonal PF 09/10/2017  . Influenza-Unspecified 11/03/2014, 12/22/2016  . Pneumococcal Conjugate-13 04/23/2013  . Pneumococcal Polysaccharide-23 01/11/2011, 01/11/2012    Qualifies for Shingles Vaccine? Yes . Due for Shingrix. Education has been provided regarding the importance of this vaccine. Pt has been advised to call insurance company to determine out of pocket expense. Advised may also receive vaccine at local pharmacy or Health Dept. Verbalized acceptance and understanding.  Tdap: Up to date  Flu Vaccine: Up to date  Pneumococcal Vaccine: Up to date  Screening Tests Health Maintenance  Topic Date Due  . COLONOSCOPY  05/17/2022  . TETANUS/TDAP  11/05/2022  . INFLUENZA VACCINE  Completed  . PNA vac Low Risk Adult  Completed   Cancer Screenings:  Colorectal Screening: No longer required.   Lung Cancer Screening: (Low Dose CT Chest recommended if Age 26-80 years, 30 pack-year currently smoking OR have quit w/in 15years.) does not qualify.    Additional Screening:  Vision Screening: Recommended annual ophthalmology exams for early detection of glaucoma and other disorders of the eye.  Dental Screening: Recommended annual dental exams for proper oral hygiene  Community Resource  Referral:  CRR required this visit?  No      Plan:  I have personally reviewed and addressed the Medicare Annual Wellness questionnaire and have noted the following in the patient's chart:  A. Medical and social history B. Use of alcohol, tobacco or illicit drugs  C. Current medications and supplements D. Functional ability and status E.  Nutritional status F.  Physical activity G. Advance directives H. List of other physicians I.  Hospitalizations, surgeries, and ER visits in previous 12 months J.  [place] such as hearing and vision if needed, cognitive and depression L. Referrals and appointments - none  In addition, I have reviewed and discussed with patient certain preventive protocols, quality metrics, and best practice recommendations. A written personalized care plan for preventive services as well as general preventive health recommendations were provided to patient.  See attached scanned questionnaire for additional information.   Signed,  [name], LPN Nurse Health Advisor   Nurse Recommendations: None.

## 2018-02-20 NOTE — Patient Instructions (Signed)
[name] , Thank you for taking time to come for your Medicare Wellness Visit. I appreciate your ongoing commitment to your health goals. Please review the following plan we discussed and let me know if I can assist you in the future.   Screening recommendations/referrals: Colonoscopy: Up to date, no longer required.  Recommended yearly ophthalmology/optometry visit for glaucoma screening and checkup Recommended yearly dental visit for hygiene and checkup  Vaccinations: Influenza vaccine: Up to date Pneumococcal vaccine: Completed series Tdap vaccine: Up to date, due 10/2022 Shingles vaccine: Pt declines today.     Advanced directives: Please bring a copy of your POA (Power of Attorney) and/or Living Will to your next appointment.   Conditions/risks identified: None.   Next appointment: Declined scheduling a physical at this time. Pt to CB after checking his schedule.    Preventive Care 27 Years and Older, Male Preventive care refers to lifestyle choices and visits with your health care provider that can promote health and wellness. What does preventive care include?  A yearly physical exam. This is also called an annual well check.  Dental exams once or twice a year.  Routine eye exams. Ask your health care provider how often you should have your eyes checked.  Personal lifestyle choices, including:  Daily care of your teeth and gums.  Regular physical activity.  Eating a healthy diet.  Avoiding tobacco and drug use.  Limiting alcohol use.  Practicing safe sex.  Taking low doses of aspirin every day.  Taking vitamin and mineral supplements as recommended by your health care provider. What happens during an annual well check? The services and screenings done by your health care provider during your annual well check will depend on your age, overall health, lifestyle risk factors, and family history of disease. Counseling  Your health care provider may ask you  questions about your:  Alcohol use.  Tobacco use.  Drug use.  Emotional well-being.  Home and relationship well-being.  Sexual activity.  Eating habits.  History of falls.  Memory and ability to understand (cognition).  Work and work Statistician. Screening  You may have the following tests or measurements:  Height, weight, and BMI.  Blood pressure.  Lipid and cholesterol levels. These may be checked every 5 years, or more frequently if you are over 49 years old.  Skin check.  Lung cancer screening. You may have this screening every year starting at age 90 if you have a 30-pack-year history of smoking and currently smoke or have quit within the past 15 years.  Fecal occult blood test (FOBT) of the stool. You may have this test every year starting at age 48.  Flexible sigmoidoscopy or colonoscopy. You may have a sigmoidoscopy every 5 years or a colonoscopy every 10 years starting at age 26.  Prostate cancer screening. Recommendations will vary depending on your family history and other risks.  Hepatitis C blood test.  Hepatitis B blood test.  Sexually transmitted disease (STD) testing.  Diabetes screening. This is done by checking your blood sugar (glucose) after you have not eaten for a while (fasting). You may have this done every 1-3 years.  Abdominal aortic aneurysm (AAA) screening. You may need this if you are a current or former smoker.  Osteoporosis. You may be screened starting at age 2 if you are at high risk. Talk with your health care provider about your test results, treatment options, and if necessary, the need for more tests. Vaccines  Your health care provider may  recommend certain vaccines, such as:  Influenza vaccine. This is recommended every year.  Tetanus, diphtheria, and acellular pertussis (Tdap, Td) vaccine. You may need a Td booster every 10 years.  Zoster vaccine. You may need this after age 26.  Pneumococcal 13-valent conjugate  (PCV13) vaccine. One dose is recommended after age 59.  Pneumococcal polysaccharide (PPSV23) vaccine. One dose is recommended after age 61. Talk to your health care provider about which screenings and vaccines you need and how often you need them. This information is not intended to replace advice given to you by your health care provider. Make sure you discuss any questions you have with your health care provider. Document Released: 01/21/2015 Document Revised: 09/14/2015 Document Reviewed: 10/26/2014 Elsevier Interactive Patient Education  2017 [place] Prevention in the Home Falls can cause injuries. They can happen to people of all ages. There are many things you can do to make your home safe and to help prevent falls. What can I do on the outside of my home?  Regularly fix the edges of walkways and driveways and fix any cracks.  Remove anything that might make you trip as you walk through a door, such as a raised step or threshold.  Trim any bushes or trees on the path to your home.  Use bright outdoor lighting.  Clear any walking paths of anything that might make someone trip, such as rocks or tools.  Regularly check to see if handrails are loose or broken. Make sure that both sides of any steps have handrails.  Any raised decks and porches should have guardrails on the edges.  Have any leaves, snow, or ice cleared regularly.  Use sand or salt on walking paths during winter.  Clean up any spills in your garage right away. This includes oil or grease spills. What can I do in the bathroom?  Use night lights.  Install grab bars by the toilet and in the tub and shower. Do not use towel bars as grab bars.  Use non-skid mats or decals in the tub or shower.  If you need to sit down in the shower, use a plastic, non-slip stool.  Keep the floor dry. Clean up any water that spills on the floor as soon as it happens.  Remove soap buildup in the tub or shower  regularly.  Attach bath mats securely with double-sided non-slip rug tape.  Do not have throw rugs and other things on the floor that can make you trip. What can I do in the bedroom?  Use night lights.  Make sure that you have a light by your bed that is easy to reach.  Do not use any sheets or blankets that are too big for your bed. They should not hang down onto the floor.  Have a firm chair that has side arms. You can use this for support while you get dressed.  Do not have throw rugs and other things on the floor that can make you trip. What can I do in the kitchen?  Clean up any spills right away.  Avoid walking on wet floors.  Keep items that you use a lot in easy-to-reach places.  If you need to reach something above you, use a strong step stool that has a grab bar.  Keep electrical cords out of the way.  Do not use floor polish or wax that makes floors slippery. If you must use wax, use non-skid floor wax.  Do not have throw rugs and  other things on the floor that can make you trip. What can I do with my stairs?  Do not leave any items on the stairs.  Make sure that there are handrails on both sides of the stairs and use them. Fix handrails that are broken or loose. Make sure that handrails are as long as the stairways.  Check any carpeting to make sure that it is firmly attached to the stairs. Fix any carpet that is loose or worn.  Avoid having throw rugs at the top or bottom of the stairs. If you do have throw rugs, attach them to the floor with carpet tape.  Make sure that you have a light switch at the top of the stairs and the bottom of the stairs. If you do not have them, ask someone to add them for you. What else can I do to help prevent falls?  Wear shoes that:  Do not have high heels.  Have rubber bottoms.  Are comfortable and fit you well.  Are closed at the toe. Do not wear sandals.  If you use a stepladder:  Make sure that it is fully  opened. Do not climb a closed stepladder.  Make sure that both sides of the stepladder are locked into place.  Ask someone to hold it for you, if possible.  Clearly mark and make sure that you can see:  Any grab bars or handrails.  First and last steps.  Where the edge of each step is.  Use tools that help you move around (mobility aids) if they are needed. These include:  Canes.  Walkers.  Scooters.  Crutches.  Turn on the lights when you go into a dark area. Replace any light bulbs as soon as they burn out.  Set up your furniture so you have a clear path. Avoid moving your furniture around.  If any of your floors are uneven, fix them.  If there are any pets around you, be aware of where they are.  Review your medicines with your doctor. Some medicines can make you feel dizzy. This can increase your chance of falling. Ask your doctor what other things that you can do to help prevent falls. This information is not intended to replace advice given to you by your health care provider. Make sure you discuss any questions you have with your health care provider. Document Released: 10/21/2008 Document Revised: 06/02/2015 Document Reviewed: 01/29/2014 Elsevier Interactive Patient Education  2017 [employer].

## 2018-04-24 ENCOUNTER — Other Ambulatory Visit: Payer: Self-pay | Admitting: Family Medicine

## 2018-04-24 DIAGNOSIS — I10 Essential (primary) hypertension: Secondary | ICD-10-CM

## 2018-06-23 ENCOUNTER — Other Ambulatory Visit: Payer: Self-pay | Admitting: Family Medicine

## 2018-07-30 DIAGNOSIS — H401131 Primary open-angle glaucoma, bilateral, mild stage: Secondary | ICD-10-CM | POA: Diagnosis not present

## 2018-08-13 DIAGNOSIS — H401131 Primary open-angle glaucoma, bilateral, mild stage: Secondary | ICD-10-CM | POA: Diagnosis not present

## 2018-09-24 ENCOUNTER — Other Ambulatory Visit: Payer: Self-pay | Admitting: Family Medicine

## 2018-10-19 ENCOUNTER — Other Ambulatory Visit: Payer: Self-pay | Admitting: Family Medicine

## 2018-10-19 DIAGNOSIS — I10 Essential (primary) hypertension: Secondary | ICD-10-CM

## 2018-10-20 NOTE — Patient Instructions (Signed)
.   Please review the attached list of medications and notify my office if there are any errors.   . Please bring all of your medications to every appointment so we can make sure that our medication list is the same as yours.   . It is especially important to get the annual flu vaccine this year. If you haven't had it already, please go to your pharmacy or call the office as soon as possible to schedule you flu shot.  

## 2018-10-21 ENCOUNTER — Encounter: Payer: Self-pay | Admitting: Family Medicine

## 2018-10-21 ENCOUNTER — Ambulatory Visit (INDEPENDENT_AMBULATORY_CARE_PROVIDER_SITE_OTHER): Payer: PPO | Admitting: Family Medicine

## 2018-10-21 ENCOUNTER — Other Ambulatory Visit: Payer: Self-pay

## 2018-10-21 VITALS — BP 108/60 | HR 83 | Temp 96.9°F | Resp 15 | Ht 69.0 in | Wt 189.2 lb

## 2018-10-21 DIAGNOSIS — H9192 Unspecified hearing loss, left ear: Secondary | ICD-10-CM | POA: Diagnosis not present

## 2018-10-21 DIAGNOSIS — G4733 Obstructive sleep apnea (adult) (pediatric): Secondary | ICD-10-CM | POA: Diagnosis not present

## 2018-10-21 DIAGNOSIS — R739 Hyperglycemia, unspecified: Secondary | ICD-10-CM | POA: Diagnosis not present

## 2018-10-21 DIAGNOSIS — Z125 Encounter for screening for malignant neoplasm of prostate: Secondary | ICD-10-CM | POA: Diagnosis not present

## 2018-10-21 DIAGNOSIS — I10 Essential (primary) hypertension: Secondary | ICD-10-CM

## 2018-10-21 DIAGNOSIS — Z0000 Encounter for general adult medical examination without abnormal findings: Secondary | ICD-10-CM

## 2018-10-21 DIAGNOSIS — R7303 Prediabetes: Secondary | ICD-10-CM

## 2018-10-21 NOTE — Progress Notes (Signed)
Patient: Alexander Espinoza, Male    DOB: [birth year], 78 y.o.   MRN: [id number] Visit Date: 10/21/2018  Today's Provider: Lelon Huh, MD   Chief Complaint  Patient presents with  . Annual Exam  . Hypertension  . Hyperglycemia   Subjective:     Complete Physical Alexander Espinoza is a 78 y.o. male. He feels well. He reports he is not actively exercising . He reports he is sleeping well.  AWV-02/20/2018 -----------------------------------------------------------   Hypertension, follow-up:  BP Readings from Last 3 Encounters:  10/21/18 108/60  02/20/18 140/70  01/23/17 (!) 158/88    He was last seen for hypertension 8 months ago.  BP at that visit was 148/80. Management changes since that visit include none. He reports excellent compliance with treatment. He is not having side effects.  He is not exercising. He is adherent to low salt diet.   Outside blood pressures are not being checked. He is experiencing none.  Patient denies chest pain, chest pressure/discomfort, claudication, dyspnea, exertional chest pressure/discomfort, fatigue, irregular heart beat, lower extremity edema, near-syncope, orthopnea, palpitations, paroxysmal nocturnal dyspnea, syncope and tachypnea.   Cardiovascular risk factors include advanced age (older than 65 for men, 32 for women), hypertension and male gender.  Use of agents associated with hypertension: none.     Weight trend: decreasing steadily Wt Readings from Last 3 Encounters:  10/21/18 189 lb 3.2 oz (85.8 kg)  02/20/18 189 lb 3.2 oz (85.8 kg)  01/23/17 195 lb (88.5 kg)    Current diet: well balanced  ------------------------------------------------------------------------  Prediabetes, Follow-up:   Lab Results  Component Value Date   HGBA1C 6.3 (H) 01/23/2017   HGBA1C 6.3 (H) 01/23/2016   HGBA1C 6.2 (H) 01/24/2015   GLUCOSE 146 (H) 01/23/2017   GLUCOSE 142 (H) 01/23/2016   GLUCOSE 121 (H) 01/24/2015    Last seen  for for this8 months ago.  Management since that visit includes advising patient to avoid starchy foods and sweets. Current symptoms include none and have been unchanged.  Weight trend: decreasing steadily Prior visit with dietician: no Current diet: well balanced Current exercise: no regular exercise  Pertinent Labs:    Component Value Date/Time   CHOL 171 01/23/2017 1037   TRIG 198 (H) 01/23/2017 1037   CHOLHDL 4.0 01/23/2017 1037   CREATININE 1.04 01/23/2017 1037    Wt Readings from Last 3 Encounters:  10/21/18 189 lb 3.2 oz (85.8 kg)  02/20/18 189 lb 3.2 oz (85.8 kg)  01/23/17 195 lb (88.5 kg)   States that he stopped using CPAP last year and feels fine, not sleepy, wife doesn't see him stop breathing.   Review of Systems  Constitutional: Negative.   HENT: Positive for hearing loss and tinnitus.   Eyes: Negative.   Respiratory: Negative.   Cardiovascular: Negative.   Gastrointestinal: Negative.   Endocrine: Negative.   Genitourinary: Negative.   Musculoskeletal: Negative.   Skin: Negative.   Allergic/Immunologic: Negative.   Neurological: Negative.   Hematological: Negative.   Psychiatric/Behavioral: Negative.   All other systems reviewed and are negative.   Social History   Socioeconomic History  . Marital status: Married    Spouse name: Not on file  . Number of children: 2  . Years of education: Not on file  . Highest education level: Some college, no degree  Occupational History  . Occupation: retired  Scientific laboratory technician  . Financial resource strain: Not hard at all  . Food insecurity    Worry:  Never true    Inability: Never true  . Transportation needs    Medical: No    Non-medical: No  Tobacco Use  . Smoking status: Former Smoker    Packs/day: 1.00    Years: 13.00    Pack years: 13.00    Types: Cigarettes    Quit date: 01/09/1968    Years since quitting: 50.8  . Smokeless tobacco: Never Used  Substance and Sexual Activity  . Alcohol use: Yes     Alcohol/week: 0.0 standard drinks    Comment: one beer every once in a while  . Drug use: No  . Sexual activity: Not on file  Lifestyle  . Physical activity    Days per week: 0 days    Minutes per session: 0 min  . Stress: Only a little  Relationships  . Social Herbalist on phone: Patient refused    Gets together: Patient refused    Attends religious service: Patient refused    Active member of club or organization: Patient refused    Attends meetings of clubs or organizations: Patient refused    Relationship status: Patient refused  . Intimate partner violence    Fear of current or ex partner: No    Emotionally abused: No    Physically abused: No    Forced sexual activity: No  Other Topics Concern  . Not on file  Social History Narrative  . Not on file    Past Medical History:  Diagnosis Date  . ED (erectile dysfunction)   . History of mumps   . Hypertension   . OSA (obstructive sleep apnea)   . Prediabetes   . Restless leg      Patient Active Problem List   Diagnosis Date Noted  . 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 Smoking history 01/23/2017  . Bursitis of right shoulder 08/08/2016  . Erectile dysfunction 01/18/2015  . 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 Prediabetes 01/18/2015  . Restless leg 01/18/2015  . Sleep disturbance 01/18/2015  . Tremor 01/18/2015  . Obstructive sleep apnea 04/24/2013  . Hypertension 10/11/2011  . 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 Family history of malignant neoplasm of prostate 10/11/2011  . Proteinuria 10/11/2011  . Hyperglycemia 10/11/2011    Past Surgical History:  Procedure Laterality Date  . Sleep study  04/24/2013   AHI=15.9, RDI=17.1 on MSPG    His family history includes Parkinsonism in his maternal uncle; Prostate cancer in his father; Stroke in his sister.   Current Outpatient Medications:  .  acetaminophen (TYLENOL) 500 MG tablet, Take 2 tablets (1,000 mg total) by  mouth every 6 (six) hours as needed for moderate pain., Disp: 30 tablet, Rfl: 0 .  amLODipine (NORVASC) 5 MG tablet, TAKE 1 TABLET(5 MG) BY MOUTH DAILY, Disp: 90 tablet, Rfl: 4 .  latanoprost (XALATAN) 0.005 % ophthalmic solution, INSTILL 1 DROP IN OU QHS, Disp: , Rfl:  .  losartan-hydrochlorothiazide (HYZAAR) 100-25 MG tablet, Take 1 tablet by mouth daily., Disp: 90 tablet, Rfl: 1 .  metoprolol succinate (TOPROL-XL) 100 MG 24 hr tablet, TAKE 1 TABLET BY MOUTH DAILY, Disp: 90 tablet, Rfl: 4 .  tadalafil (CIALIS) 5 MG tablet, Take 1 tablet (5 mg total) by mouth daily., Disp: 30 tablet, Rfl: 5  Patient Care Team: Birdie Sons, MD as PCP - General (Family Medicine) Odette Fraction as Consulting Physician (Optometry) Jannet Mantis, MD as Consulting Physician (Dermatology)  Objective:    Vitals: BP 108/60   Pulse 83   Temp (!) 96.9 F (36.1 C) (Oral)   Resp 15   Ht 5\' 9"  (1.753 m)   Wt 189 lb 3.2 oz (85.8 kg)   SpO2 98%   BMI 27.94 kg/m   Physical Exam   General Appearance:    Overweight male. Alert, cooperative, in no acute distress, appears stated age  Head:    Normocephalic, without obvious abnormality, atraumatic  Eyes:    PERRL, conjunctiva/corneas clear, EOM's intact, fundi    benign, both eyes       Ears:    Normal TM's and external ear canals, both ears  Nose:   Nares normal, septum midline, mucosa normal, no drainage   or sinus tenderness  Throat:   Lips, mucosa, and tongue normal; teeth and gums normal  Neck:   Supple, symmetrical, trachea midline, no adenopathy;       thyroid:  No enlargement/tenderness/nodules; no carotid   bruit or JVD  Back:     Symmetric, no curvature, ROM normal, no CVA tenderness  Lungs:     Clear to auscultation bilaterally, respirations unlabored  Chest wall:    No tenderness or deformity  Heart:    Normal heart rate. Normal rhythm. No murmurs, rubs, or gallops.  S1 and S2 normal  Abdomen:     Soft, non-tender, bowel sounds  active all four quadrants,    no masses, no organomegaly  Genitalia:    deferred  Rectal:    deferred  Extremities:   All extremities are intact. No cyanosis or edema  Pulses:   2+ and symmetric all extremities  Skin:   Skin color, texture, turgor normal, no rashes. 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 He is followed regularly by Dr. Doylene Canard. No unusually pigmented or otherwise malignant appearing lesions.   Lymph nodes:   Cervical, supraclavicular, and axillary nodes normal  Neurologic:   CNII-XII intact. Normal strength, sensation and reflexes      throughout    Activities of Daily Living In your present state of health, do you have any difficulty performing the following activities: 02/20/2018  Hearing? Y  Comment Loss of hearing in left ear. Does not wear hearing aids.   Vision? N  Difficulty concentrating or making decisions? N  Walking or climbing stairs? N  Dressing or bathing? N  Doing errands, shopping? N  Preparing Food and eating ? N  Using the Toilet? N  In the past six months, have you accidently leaked urine? N  Do you have problems with loss of bowel control? N  Managing your Medications? N  Managing your Finances? N  Housekeeping or managing your Housekeeping? N  Some recent data might be hidden    Fall Risk Assessment Fall Risk  02/20/2018 01/22/2017 01/19/2016 01/18/2015  Falls in the past year? 0 No No No     Depression Screen PHQ 2/9 Scores 02/20/2018 02/20/2018 01/22/2017 01/22/2017  PHQ - 2 Score 0 0 0 0  PHQ- 9 Score 0 - 0 -    6CIT Screen 01/19/2016  What Year? 0 points  What month? 0 points  What time? 0 points  Count back from 20 0 points  Months in reverse 0 points  Repeat phrase 6 points  Total Score 6       Assessment & Plan:    Annual Physical Reviewed patient's Family Medical History Reviewed and updated list of patient's medical providers Assessment of  cognitive impairment was done Assessed patient's  functional ability Established a written schedule for health screening [place] Completed and Reviewed  Exercise Activities and Dietary recommendations Goals    . DIET - INCREASE WATER INTAKE     Recommend increasing water intake to 6 glasses or more a day.     . Increase water intake     Starting 01/19/16, I will increase my water intake to 6 glasses a day.       Immunization History  Administered Date(s) Administered  . Influenza, High Dose Seasonal PF 09/10/2017  . Influenza-Unspecified 11/03/2014, 12/22/2016  . Pneumococcal Conjugate-13 04/23/2013  . Pneumococcal Polysaccharide-23 01/11/2011, 01/11/2012    Health Maintenance  Topic Date Due  . INFLUENZA VACCINE  08/09/2018  . COLONOSCOPY  05/17/2022  . TETANUS/TDAP  11/05/2022  . PNA vac Low Risk Adult  Completed     Discussed health benefits of physical activity, and encouraged him to engage in regular exercise appropriate for his age and condition.    ------------------------------------------------------------------------------------------------------------     The entirety of the information documented in the History of Present Illness, Review of Systems and Physical Exam were personally obtained by me. Portions of this information were initially documented by Minette Headland, CMA and reviewed by me for thoroughness and accuracy.    Lelon Huh, MD  [facility]

## 2018-10-22 LAB — COMPREHENSIVE METABOLIC PANEL
ALT: 13 IU/L (ref 0–44)
AST: 15 IU/L (ref 0–40)
Albumin/Globulin Ratio: 1.5 (ref 1.2–2.2)
Albumin: 4.4 g/dL (ref 3.7–4.7)
Alkaline Phosphatase: 103 IU/L (ref 39–117)
BUN/Creatinine Ratio: 16 (ref 10–24)
BUN: 19 mg/dL (ref 8–27)
Bilirubin Total: 0.5 mg/dL (ref 0.0–1.2)
CO2: 25 mmol/L (ref 20–29)
Calcium: 9.8 mg/dL (ref 8.6–10.2)
Chloride: 96 mmol/L (ref 96–106)
Creatinine, Ser: 1.18 mg/dL (ref 0.76–1.27)
GFR calc Af Amer: 69 mL/min/{1.73_m2} (ref >59)
GFR calc non Af Amer: 60 mL/min/{1.73_m2} (ref >59)
Globulin, Total: 2.9 g/dL (ref 1.5–4.5)
Glucose: 115 mg/dL — ABNORMAL HIGH (ref 65–99)
Potassium: 4.1 mmol/L (ref 3.5–5.2)
Sodium: 139 mmol/L (ref 134–144)
Total Protein: 7.3 g/dL (ref 6.0–8.5)

## 2018-10-22 LAB — HEMOGLOBIN A1C
Est. average glucose Bld gHb Est-mCnc: 137 mg/dL
Hgb A1c MFr Bld: 6.4 % — ABNORMAL HIGH (ref 4.8–5.6)

## 2018-10-22 LAB — LIPID PANEL
Chol/HDL Ratio: 4.1 ratio (ref 0.0–5.0)
Cholesterol, Total: 169 mg/dL (ref 100–199)
HDL: 41 mg/dL (ref >39)
LDL Chol Calc (NIH): 93 mg/dL (ref 0–99)
Triglycerides: 205 mg/dL — ABNORMAL HIGH (ref 0–149)
VLDL Cholesterol Cal: 35 mg/dL (ref 5–40)

## 2018-10-22 LAB — PSA: Prostate Specific Ag, Serum: 0.5 ng/mL (ref 0.0–4.0)

## 2018-10-27 ENCOUNTER — Telehealth: Payer: Self-pay

## 2018-10-27 NOTE — Telephone Encounter (Signed)
Patient advised. Patient states he will call back later to schedule follow up appointment.

## 2018-10-27 NOTE — Telephone Encounter (Signed)
Pt returned call ° °Teri °

## 2018-10-27 NOTE — Telephone Encounter (Signed)
Tried calling patient. Left message to call back. 

## 2018-10-27 NOTE — Telephone Encounter (Signed)
-----   Message from Birdie Sons, MD sent at 10/27/2018  8:13 AM EDT ----- a1c is stable at 6.4 normal cholesterol kidney functions and psa. Continue current medications.  Follow up 6 months for a1c

## 2018-11-02 DIAGNOSIS — H9192 Unspecified hearing loss, left ear: Secondary | ICD-10-CM | POA: Insufficient documentation

## 2018-11-12 DIAGNOSIS — Z20828 Contact with and (suspected) exposure to other viral communicable diseases: Secondary | ICD-10-CM | POA: Diagnosis not present

## 2019-01-14 DIAGNOSIS — H401131 Primary open-angle glaucoma, bilateral, mild stage: Secondary | ICD-10-CM | POA: Diagnosis not present

## 2019-01-21 ENCOUNTER — Ambulatory Visit: Payer: PPO | Attending: Internal Medicine

## 2019-01-21 DIAGNOSIS — Z20822 Contact with and (suspected) exposure to covid-19: Secondary | ICD-10-CM | POA: Diagnosis not present

## 2019-01-22 LAB — NOVEL CORONAVIRUS, NAA: SARS-CoV-2, NAA: NOT DETECTED

## 2019-01-23 ENCOUNTER — Telehealth: Payer: Self-pay | Admitting: *Deleted

## 2019-01-23 NOTE — Telephone Encounter (Signed)
Pt notified of negative COVID-19 results. Understanding verbalized.   

## 2019-02-01 ENCOUNTER — Ambulatory Visit: Payer: PPO | Attending: Internal Medicine

## 2019-02-01 DIAGNOSIS — Z23 Encounter for immunization: Secondary | ICD-10-CM

## 2019-02-01 NOTE — Progress Notes (Signed)
   Covid-19 Vaccination Clinic  Name:  Alexander Espinoza    MRN: [id number] DOB: [birth year]  02/01/2019  Alexander Espinoza was observed post Covid-19 immunization for 15 minutes without incidence. He was provided with Vaccine Information Sheet and instruction to access the V-Safe system.   Alexander Espinoza was instructed to call 911 with any severe reactions post vaccine: Marland Kitchen Difficulty breathing  . Swelling of your face and throat  . A fast heartbeat  . A bad rash all over your body  . Dizziness and weakness    Immunizations Administered    Name Date Dose VIS Date Route   Pfizer COVID-19 Vaccine 02/01/2019  1:43 PM 0.3 mL 12/19/2018 Intramuscular   Manufacturer: [place]   Lot: [id number]   [place]: [id number]

## 2019-02-02 ENCOUNTER — Ambulatory Visit: Payer: PPO

## 2019-02-23 ENCOUNTER — Ambulatory Visit: Payer: PPO | Attending: Internal Medicine

## 2019-02-23 DIAGNOSIS — Z23 Encounter for immunization: Secondary | ICD-10-CM | POA: Insufficient documentation

## 2019-02-23 NOTE — Progress Notes (Signed)
   Covid-19 Vaccination Clinic  Name:  Alexander Espinoza    MRN: [id number] DOB: September 14, 1940  02/23/2019  [name] was observed post Covid-19 immunization for 15 minutes without incidence. He was provided with Vaccine Information Sheet and instruction to access the V-Safe system.   [name] was instructed to call 911 with any severe reactions post vaccine: Marland Kitchen Difficulty breathing  . Swelling of your face and throat  . A fast heartbeat  . A bad rash all over your body  . Dizziness and weakness    Immunizations Administered    Name Date Dose VIS Date Route   Pfizer COVID-19 Vaccine 02/23/2019 12:05 PM 0.3 mL 12/19/2018 Intramuscular   Manufacturer: [place]   Lot: [id number]   [place]: [id number]

## 2019-03-16 DIAGNOSIS — H401131 Primary open-angle glaucoma, bilateral, mild stage: Secondary | ICD-10-CM | POA: Diagnosis not present

## 2019-04-02 ENCOUNTER — Other Ambulatory Visit: Payer: Self-pay | Admitting: Family Medicine

## 2019-04-02 NOTE — Telephone Encounter (Signed)
Requested Prescriptions  Pending Prescriptions Disp Refills  . losartan-hydrochlorothiazide (HYZAAR) 100-25 MG tablet [Pharmacy Med Name: LOSARTAN/HCTZ 100/25MG  TABLETS] 90 tablet 1    Sig: TAKE 1 TABLET BY MOUTH DAILY     Cardiovascular: ARB + Diuretic Combos Failed - 04/02/2019 12:45 PM      Failed - Valid encounter within last 6 months    Recent Outpatient Visits          5 months ago Annual physical exam   [facility] Birdie Sons, MD   2 years ago Annual physical exam   [facility] Birdie Sons, MD   2 years ago Bursitis of right shoulder   [facility] Birdie Sons, MD   3 years ago Essential hypertension   [facility] Birdie Sons, MD   4 years ago Annual physical exam   [facility] Birdie Sons, MD             Passed - K in normal range and within 180 days    Potassium  Date Value Ref Range Status  10/21/2018 4.1 3.5 - 5.2 mmol/L Final         Passed - Na in normal range and within 180 days    Sodium  Date Value Ref Range Status  10/21/2018 139 134 - 144 mmol/L Final         Passed - Cr in normal range and within 180 days    Creatinine, Ser  Date Value Ref Range Status  10/21/2018 1.18 0.76 - 1.27 mg/dL Final         Passed - Ca in normal range and within 180 days    Calcium  Date Value Ref Range Status  10/21/2018 9.8 8.6 - 10.2 mg/dL Final         Passed - Patient is not pregnant      Passed - Last BP in normal range    BP Readings from Last 1 Encounters:  10/21/18 108/60

## 2019-05-18 DIAGNOSIS — H401131 Primary open-angle glaucoma, bilateral, mild stage: Secondary | ICD-10-CM | POA: Diagnosis not present

## 2019-06-21 ENCOUNTER — Other Ambulatory Visit: Payer: Self-pay | Admitting: Family Medicine

## 2019-08-04 ENCOUNTER — Other Ambulatory Visit: Payer: Self-pay

## 2019-09-28 DIAGNOSIS — H401131 Primary open-angle glaucoma, bilateral, mild stage: Secondary | ICD-10-CM | POA: Diagnosis not present

## 2019-10-03 ENCOUNTER — Other Ambulatory Visit: Payer: Self-pay | Admitting: Family Medicine

## 2019-10-04 ENCOUNTER — Other Ambulatory Visit: Payer: Self-pay | Admitting: Family Medicine

## 2019-10-04 NOTE — Telephone Encounter (Signed)
Requested Prescriptions  Pending Prescriptions Disp Refills  . losartan-hydrochlorothiazide (HYZAAR) 100-25 MG tablet [Pharmacy Med Name: LOSARTAN/HCTZ 100/25MG  TABLETS] 50 tablet 0    Sig: TAKE 1 TABLET BY MOUTH DAILY     Cardiovascular: ARB + Diuretic Combos Failed - 10/03/2019 10:54 AM      Failed - K in normal range and within 180 days    Potassium  Date Value Ref Range Status  10/21/2018 4.1 3.5 - 5.2 mmol/L Final         Failed - Na in normal range and within 180 days    Sodium  Date Value Ref Range Status  10/21/2018 139 134 - 144 mmol/L Final         Failed - Cr in normal range and within 180 days    Creatinine, Ser  Date Value Ref Range Status  10/21/2018 1.18 0.76 - 1.27 mg/dL Final         Failed - Ca in normal range and within 180 days    Calcium  Date Value Ref Range Status  10/21/2018 9.8 8.6 - 10.2 mg/dL Final         Failed - Valid encounter within last 6 months    Recent Outpatient Visits          11 months ago Annual physical exam   [facility] Birdie Sons, MD   2 years ago Annual physical exam   [facility] Birdie Sons, MD   3 years ago Bursitis of right shoulder   [facility] Birdie Sons, MD   3 years ago Essential hypertension   [facility] Birdie Sons, MD   4 years ago Annual physical exam   [facility] Birdie Sons, MD      Future Appointments            In 1 month Fisher, Kirstie Peri, MD [facility] [place] - Patient is not pregnant      Passed - Last BP in normal range    BP Readings from Last 1 Encounters:  10/21/18 108/60

## 2019-10-23 ENCOUNTER — Encounter: Payer: Self-pay | Admitting: Family Medicine

## 2019-11-20 ENCOUNTER — Other Ambulatory Visit: Payer: Self-pay

## 2019-11-20 ENCOUNTER — Ambulatory Visit (INDEPENDENT_AMBULATORY_CARE_PROVIDER_SITE_OTHER): Payer: PPO | Admitting: Family Medicine

## 2019-11-20 ENCOUNTER — Encounter: Payer: Self-pay | Admitting: Family Medicine

## 2019-11-20 VITALS — BP 127/75 | HR 71 | Temp 97.8°F | Resp 16 | Ht 69.0 in | Wt 179.0 lb

## 2019-11-20 DIAGNOSIS — R7303 Prediabetes: Secondary | ICD-10-CM | POA: Diagnosis not present

## 2019-11-20 DIAGNOSIS — Z0000 Encounter for general adult medical examination without abnormal findings: Secondary | ICD-10-CM

## 2019-11-20 DIAGNOSIS — R200 Anesthesia of skin: Secondary | ICD-10-CM | POA: Diagnosis not present

## 2019-11-20 DIAGNOSIS — Z125 Encounter for screening for malignant neoplasm of prostate: Secondary | ICD-10-CM | POA: Diagnosis not present

## 2019-11-20 DIAGNOSIS — I10 Essential (primary) hypertension: Secondary | ICD-10-CM

## 2019-11-20 DIAGNOSIS — R251 Tremor, unspecified: Secondary | ICD-10-CM

## 2019-11-20 DIAGNOSIS — Z23 Encounter for immunization: Secondary | ICD-10-CM

## 2019-11-20 NOTE — Progress Notes (Signed)
Annual Wellness Visit     Patient: Alexander Espinoza, Male    DOB: [birth year], 79 y.o.   MRN: [id number] Visit Date: 11/20/2019  Today's Provider: Lelon Huh, MD   Chief Complaint  Patient presents with   Hypertension   Medicare Wellness   Prediabetes   Tremors   Subjective    Alexander Espinoza is a 79 y.o. male who presents today for his Annual Wellness Visit. He reports consuming a general diet.  He generally feels fairly well. He reports sleeping fairly well. He does have additional problems to discuss today.   HPI Hypertension, follow-up  BP Readings from Last 3 Encounters:  10/21/18 108/60  02/20/18 140/70  01/23/17 (!) 158/88   Wt Readings from Last 3 Encounters:  11/20/19 179 lb (81.2 kg)  10/21/18 189 lb 3.2 oz (85.8 kg)  02/20/18 189 lb 3.2 oz (85.8 kg)     He was last seen for hypertension 1 year ago.  BP at that visit was 108/60. Management since that visit includes continuing same medication.  He reports good compliance with treatment. He is not having side effects.  He is following a Regular diet. He is not exercising. He does not smoke.  Use of agents associated with hypertension: none.   Outside blood pressures are not checked. Symptoms: No chest pain No chest pressure  No palpitations No syncope  No dyspnea No orthopnea  No paroxysmal nocturnal dyspnea No lower extremity edema   Pertinent labs: Lab Results  Component Value Date   CHOL 169 10/21/2018   HDL 41 10/21/2018   LDLCALC 93 10/21/2018   TRIG 205 (H) 10/21/2018   CHOLHDL 4.1 10/21/2018   Lab Results  Component Value Date   NA 139 10/21/2018   K 4.1 10/21/2018   CREATININE 1.18 10/21/2018   GFRNONAA 60 10/21/2018   GFRAA 69 10/21/2018   GLUCOSE 115 (H) 10/21/2018     The ASCVD Risk score (Goff DC Jr., et al., 2013) failed to calculate for the following reasons:   The systolic blood pressure is missing    ---------------------------------------------------------------------------------------------------  Prediabetes, Follow-up  Lab Results  Component Value Date   HGBA1C 6.4 (H) 10/21/2018   HGBA1C 6.3 (H) 01/23/2017   HGBA1C 6.3 (H) 01/23/2016   GLUCOSE 115 (H) 10/21/2018   GLUCOSE 146 (H) 01/23/2017   GLUCOSE 142 (H) 01/23/2016    Last seen for for this1 year ago.  Management since that visit includes continuing healthy diet. Current symptoms include none and have been stable.  Prior visit with dietician: no Current diet: well balanced Current exercise: none  Pertinent Labs:    Component Value Date/Time   CHOL 169 10/21/2018 1547   TRIG 205 (H) 10/21/2018 1547   CHOLHDL 4.1 10/21/2018 1547   CREATININE 1.18 10/21/2018 1547    Wt Readings from Last 3 Encounters:  11/20/19 179 lb (81.2 kg)  10/21/18 189 lb 3.2 oz (85.8 kg)  02/20/18 189 lb 3.2 oz (85.8 kg)    -----------------------------------------------------------------------------------------  Tremors: Patient complains of hand tremors. This has been ongoing for the past 3 years. Tremors started in his left hand, but has now started to occur in both hand. He also has numbness in the right leg. Tremor makes it difficult to hold cups and use a pen, although he can hold eating utensils with right hand without difficulty. His son states that patient occasionally has tremors in his hands when at rest and not paying attention. He denies any difficulty with  speech, head shaking or gait disturbances.      Medications: Outpatient Medications Prior to Visit  Medication Sig   acetaminophen (TYLENOL) 500 MG tablet Take 2 tablets (1,000 mg total) by mouth every 6 (six) hours as needed for moderate pain.   amLODipine (NORVASC) 5 MG tablet TAKE 1 TABLET(5 MG) BY MOUTH DAILY   latanoprost (XALATAN) 0.005 % ophthalmic solution INSTILL 1 DROP IN OU QHS   losartan-hydrochlorothiazide (HYZAAR) 100-25 MG tablet TAKE 1 TABLET  BY MOUTH DAILY   metoprolol succinate (TOPROL-XL) 100 MG 24 hr tablet TAKE 1 TABLET BY MOUTH DAILY   tadalafil (CIALIS) 5 MG tablet Take 1 tablet (5 mg total) by mouth daily.   No facility-administered medications prior to visit.    No Known Allergies  Patient Care Team: Birdie Sons, MD as PCP - General (Family Medicine) Odette Fraction as Consulting Physician (Optometry) Jannet Mantis, MD as Consulting Physician (Dermatology)  Review of Systems  Constitutional: Negative for appetite change, chills, fatigue and fever.  HENT: Negative for congestion, ear pain, hearing loss, nosebleeds and trouble swallowing.   Eyes: Negative for pain and visual disturbance.  Respiratory: Negative for cough, chest tightness and shortness of breath.   Cardiovascular: Negative for chest pain, palpitations and leg swelling.  Gastrointestinal: Negative for abdominal pain, blood in stool, constipation, diarrhea, nausea and vomiting.  Endocrine: Negative for polydipsia, polyphagia and polyuria.  Genitourinary: Negative for dysuria and flank pain.  Musculoskeletal: Negative for arthralgias, back pain, joint swelling, myalgias and neck stiffness.  Skin: Negative for color change, rash and wound.  Neurological: Positive for tremors (in hands) and numbness (right leg). Negative for dizziness, seizures, speech difficulty, weakness, light-headedness and headaches.  Psychiatric/Behavioral: Negative for behavioral problems, confusion, decreased concentration, dysphoric mood and sleep disturbance. The patient is not nervous/anxious.   All other systems reviewed and are negative.     Objective    Vitals: Temp 97.8 F (36.6 C) (Oral)    Resp 16    Ht 5\' 9"  (1.753 m)    Wt 179 lb (81.2 kg)    BMI 26.43 kg/m    Physical Exam  General: Appearance:     Well developed, well nourished male in no acute distress  Eyes:    PERRL, conjunctiva/corneas clear, EOM's intact       Lungs:     Clear to  auscultation bilaterally, respirations unlabored  Heart:    Normal heart rate. Normal rhythm. No murmurs, rubs, or gallops.   MS:   All extremities are intact.   Neurologic:   Awake, alert, oriented x 3. Moderate tremors left worse than right when holding his hands out. No rest tremor appreciated. + cogwheeling L>R. Normal gait and balance.        Most recent functional status assessment: No flowsheet data found. Most recent fall risk assessment: Fall Risk  08/04/2019  Falls in the past year? 0  Comment Emmi Telephone Survey: data to providers prior to load    Most recent depression screenings: PHQ 2/9 Scores 02/20/2018 02/20/2018  PHQ - 2 Score 0 0  PHQ- 9 Score 0 -   Most recent cognitive screening: 6CIT Screen 01/19/2016  What Year? 0 points  What month? 0 points  What time? 0 points  Count back from 20 0 points  Months in reverse 0 points  Repeat phrase 6 points  Total Score 6   Most recent Audit-C alcohol use screening No flowsheet data found. A score of 3 or more in  women, and 4 or more in men indicates increased risk for alcohol abuse, EXCEPT if all of the points are from question 1   No results found for any visits on 11/20/19.  Assessment & Plan     Annual wellness visit done today including the all of the following: Reviewed patient's Family Medical History Reviewed and updated list of patient's medical providers Assessment of cognitive impairment was done Assessed patient's functional ability Established a written schedule for health screening [place] Completed and Reviewed  Exercise Activities and Dietary recommendations Goals     DIET - INCREASE WATER INTAKE     Recommend increasing water intake to 6 glasses or more a day.      Increase water intake     Starting 01/19/16, I will increase my water intake to 6 glasses a day.       Immunization History  Administered Date(s) Administered   Influenza, High Dose Seasonal PF  09/10/2017   Influenza-Unspecified 11/03/2014, 12/22/2016   PFIZER SARS-COV-2 Vaccination 02/01/2019, 02/23/2019   Pneumococcal Conjugate-13 04/23/2013   Pneumococcal Polysaccharide-23 01/11/2011, 01/11/2012    Health Maintenance  Topic Date Due   Hepatitis C Screening  Never done   INFLUENZA VACCINE  08/09/2019   TETANUS/TDAP  11/05/2022   COVID-19 Vaccine  Completed   PNA vac Low Risk Adult  Completed     Discussed health benefits of physical activity, and encouraged him to engage in regular exercise appropriate for his age and condition.    1. Tremor and numbness of right foot.  Did not appreciate tremor at rest today, but his son reports he has noticed patients tremors at rest at home. He also has some cog wheeling, but no gait disturbances. Cannot rule out Parkinson's based on history alone. Will check labs. They are interested in additional neuro evaluation if labs are not diagnostic.  - TSH - T4, free - Vitamin B12  2. Primary hypertension Well controlled.  Continue current medications.   - CBC - Comprehensive metabolic panel - Lipid panel  3. Prediabetes  - Hemoglobin A1c - Vitamin B12  4. Encounter for immunization  - [employer] Vaccine #3  5. Prostate cancer screening  - PSA Total (Reflex To Free) ([facility] only)  6. Medicare annual wellness visit, subsequent       The entirety of the information documented in the History of Present Illness, Review of Systems and Physical Exam were personally obtained by me. Portions of this information were initially documented by the CMA and reviewed by me for thoroughness and accuracy.      Lelon Huh, MD  [facility] [phone] (phone) [phone] (fax)  [place]

## 2019-11-20 NOTE — Patient Instructions (Signed)
.   Please review the attached list of medications and notify my office if there are any errors.   The CDC recommends two doses of Shingrix (the shingles vaccine) separated by 2 to 6 months for adults age 77 years and older. I recommend checking with your pharmacy plan regarding coverage for this vaccine.     You are due for a Tdap (tetanus-diptheria-pertussis vaccine) which protects you from tetanus and whooping cough. Please check with your insurance plan or pharmacy regarding coverage for this vaccine.   

## 2019-11-21 LAB — COMPREHENSIVE METABOLIC PANEL
ALT: 13 IU/L (ref 0–44)
AST: 16 IU/L (ref 0–40)
Albumin/Globulin Ratio: 1.6 (ref 1.2–2.2)
Albumin: 4.4 g/dL (ref 3.7–4.7)
Alkaline Phosphatase: 96 IU/L (ref 44–121)
BUN/Creatinine Ratio: 12 (ref 10–24)
BUN: 14 mg/dL (ref 8–27)
Bilirubin Total: 0.5 mg/dL (ref 0.0–1.2)
CO2: 29 mmol/L (ref 20–29)
Calcium: 10.1 mg/dL (ref 8.6–10.2)
Chloride: 97 mmol/L (ref 96–106)
Creatinine, Ser: 1.13 mg/dL (ref 0.76–1.27)
GFR calc Af Amer: 73 mL/min/{1.73_m2} (ref >59)
GFR calc non Af Amer: 63 mL/min/{1.73_m2} (ref >59)
Globulin, Total: 2.7 g/dL (ref 1.5–4.5)
Glucose: 128 mg/dL — ABNORMAL HIGH (ref 65–99)
Potassium: 4.5 mmol/L (ref 3.5–5.2)
Sodium: 140 mmol/L (ref 134–144)
Total Protein: 7.1 g/dL (ref 6.0–8.5)

## 2019-11-21 LAB — T4, FREE: Free T4: 1.29 ng/dL (ref 0.82–1.77)

## 2019-11-21 LAB — LIPID PANEL
Chol/HDL Ratio: 3.9 ratio (ref 0.0–5.0)
Cholesterol, Total: 167 mg/dL (ref 100–199)
HDL: 43 mg/dL (ref >39)
LDL Chol Calc (NIH): 100 mg/dL — ABNORMAL HIGH (ref 0–99)
Triglycerides: 134 mg/dL (ref 0–149)
VLDL Cholesterol Cal: 24 mg/dL (ref 5–40)

## 2019-11-21 LAB — VITAMIN B12: Vitamin B-12: 482 pg/mL (ref 232–1245)

## 2019-11-21 LAB — CBC
Hematocrit: 45.7 % (ref 37.5–51.0)
Hemoglobin: 15.2 g/dL (ref 13.0–17.7)
MCH: 29.6 pg (ref 26.6–33.0)
MCHC: 33.3 g/dL (ref 31.5–35.7)
MCV: 89 fL (ref 79–97)
Platelets: 329 10*3/uL (ref 150–450)
RBC: 5.14 x10E6/uL (ref 4.14–5.80)
RDW: 12.6 % (ref 11.6–15.4)
WBC: 11.2 10*3/uL — ABNORMAL HIGH (ref 3.4–10.8)

## 2019-11-21 LAB — PSA TOTAL (REFLEX TO FREE): Prostate Specific Ag, Serum: 0.4 ng/mL (ref 0.0–4.0)

## 2019-11-21 LAB — HEMOGLOBIN A1C
Est. average glucose Bld gHb Est-mCnc: 151 mg/dL
Hgb A1c MFr Bld: 6.9 % — ABNORMAL HIGH (ref 4.8–5.6)

## 2019-11-21 LAB — TSH: TSH: 2.75 u[IU]/mL (ref 0.450–4.500)

## 2019-11-25 ENCOUNTER — Other Ambulatory Visit: Payer: Self-pay | Admitting: Family Medicine

## 2019-11-25 ENCOUNTER — Telehealth: Payer: Self-pay

## 2019-11-25 DIAGNOSIS — R7303 Prediabetes: Secondary | ICD-10-CM

## 2019-11-25 DIAGNOSIS — R251 Tremor, unspecified: Secondary | ICD-10-CM

## 2019-11-25 MED ORDER — METFORMIN HCL ER 500 MG PO TB24: 500.0000 mg | ORAL_TABLET | Freq: Every day | ORAL | 4 refills | Status: DC

## 2019-11-25 NOTE — Telephone Encounter (Signed)
Requested Prescriptions  Pending Prescriptions Disp Refills   losartan-hydrochlorothiazide (HYZAAR) 100-25 MG tablet [Pharmacy Med Name: LOSARTAN/HCTZ 100/25MG  TABLETS] 90 tablet 1    Sig: TAKE 1 TABLET BY MOUTH DAILY     Cardiovascular: ARB + Diuretic Combos Passed - 11/25/2019  2:12 PM      Passed - K in normal range and within 180 days    Potassium  Date Value Ref Range Status  11/20/2019 4.5 3.5 - 5.2 mmol/L Final         Passed - Na in normal range and within 180 days    Sodium  Date Value Ref Range Status  11/20/2019 140 134 - 144 mmol/L Final         Passed - Cr in normal range and within 180 days    Creatinine, Ser  Date Value Ref Range Status  11/20/2019 1.13 0.76 - 1.27 mg/dL Final         Passed - Ca in normal range and within 180 days    Calcium  Date Value Ref Range Status  11/20/2019 10.1 8.6 - 10.2 mg/dL Final         Passed - Patient is not pregnant      Passed - Last BP in normal range    BP Readings from Last 1 Encounters:  11/20/19 127/75         Passed - Valid encounter within last 6 months    Recent Outpatient Visits          5 days ago Medicare annual wellness visit, subsequent   [facility] Birdie Sons, MD   1 year ago Annual physical exam   [facility] Birdie Sons, MD   2 years ago Annual physical exam   [facility] Birdie Sons, MD   3 years ago Bursitis of right shoulder   [facility] Birdie Sons, MD   3 years ago Essential hypertension   [place], Kirstie Peri, MD

## 2019-11-25 NOTE — Telephone Encounter (Signed)
-----   Message from Birdie Sons, MD sent at 11/21/2019  8:20 AM EST ----- Labs show average blood sugar is no 151, which is in the diabetic range. Need to start metformin ER 500mg  once a day, #30 rf x 4. Rest of labs are normal. Need referral to neurology for evaluation of tremor.  Need to schedule follow up regarding metformin and A1c in 4 months.

## 2019-11-25 NOTE — Telephone Encounter (Signed)
Advised patient of results. Medication was sent into the pharmacy. Referral was placed to neurology.

## 2019-12-07 ENCOUNTER — Telehealth: Payer: Self-pay | Admitting: Family Medicine

## 2019-12-07 NOTE — Telephone Encounter (Signed)
Please check with patient to see if he ever got the metformin that was sent to pharmacy earlier this month, and need to schedule follow for diabetes in mid march. Thanks.

## 2019-12-09 NOTE — Telephone Encounter (Signed)
I called and spoke with patient. He was able to get the Metformin prescription that was sent into the pharmacy and he has been taking it every day. He is tolerating the medication well. Patient wants to call back when it gets closer to March to schedule a follow up appointment. He says he doesn't know his schedule right now that far in advance and would rather wait.

## 2020-01-10 ENCOUNTER — Other Ambulatory Visit: Payer: Self-pay | Admitting: Family Medicine

## 2020-01-10 DIAGNOSIS — I10 Essential (primary) hypertension: Secondary | ICD-10-CM

## 2020-01-22 DIAGNOSIS — R259 Unspecified abnormal involuntary movements: Secondary | ICD-10-CM | POA: Diagnosis not present

## 2020-01-22 DIAGNOSIS — E538 Deficiency of other specified B group vitamins: Secondary | ICD-10-CM | POA: Diagnosis not present

## 2020-01-22 DIAGNOSIS — E559 Vitamin D deficiency, unspecified: Secondary | ICD-10-CM | POA: Diagnosis not present

## 2020-01-22 DIAGNOSIS — R251 Tremor, unspecified: Secondary | ICD-10-CM | POA: Diagnosis not present

## 2020-01-26 ENCOUNTER — Other Ambulatory Visit: Payer: Self-pay | Admitting: Family Medicine

## 2020-01-26 DIAGNOSIS — I10 Essential (primary) hypertension: Secondary | ICD-10-CM

## 2020-01-27 ENCOUNTER — Other Ambulatory Visit (HOSPITAL_COMMUNITY): Payer: Self-pay | Admitting: Neurology

## 2020-01-27 ENCOUNTER — Other Ambulatory Visit: Payer: Self-pay | Admitting: Neurology

## 2020-01-27 DIAGNOSIS — R251 Tremor, unspecified: Secondary | ICD-10-CM

## 2020-02-10 ENCOUNTER — Ambulatory Visit (HOSPITAL_COMMUNITY)
Admission: RE | Admit: 2020-02-10 | Discharge: 2020-02-10 | Disposition: A | Discharge status: Home / Self Care | Payer: PPO | Source: Ambulatory Visit | Attending: Neurology | Admitting: Neurology

## 2020-02-10 ENCOUNTER — Other Ambulatory Visit: Payer: Self-pay

## 2020-02-10 DIAGNOSIS — R251 Tremor, unspecified: Secondary | ICD-10-CM | POA: Diagnosis not present

## 2020-02-10 DIAGNOSIS — J320 Chronic maxillary sinusitis: Secondary | ICD-10-CM | POA: Diagnosis not present

## 2020-02-10 DIAGNOSIS — R930 Abnormal findings on diagnostic imaging of skull and head, not elsewhere classified: Secondary | ICD-10-CM | POA: Diagnosis not present

## 2020-02-10 DIAGNOSIS — J3489 Other specified disorders of nose and nasal sinuses: Secondary | ICD-10-CM | POA: Diagnosis not present

## 2020-03-02 ENCOUNTER — Telehealth: Payer: Self-pay | Admitting: Family Medicine

## 2020-03-02 NOTE — Telephone Encounter (Signed)
Appointment scheduled for 03/23/2019 at 1:20pm.

## 2020-03-02 NOTE — Telephone Encounter (Signed)
Tried calling patient. Left message to call back. OK for PEC  to advise patient and schedule follow up appointment.

## 2020-03-02 NOTE — Telephone Encounter (Signed)
Please advise patient it is time to schedule follow up for pre- diabetes. He was started on metformin in November and he was to follow up in March to check A1c.

## 2020-03-03 DIAGNOSIS — D485 Neoplasm of uncertain behavior of skin: Secondary | ICD-10-CM | POA: Diagnosis not present

## 2020-03-03 DIAGNOSIS — C4449 Other specified malignant neoplasm of skin of scalp and neck: Secondary | ICD-10-CM | POA: Diagnosis not present

## 2020-03-03 DIAGNOSIS — L570 Actinic keratosis: Secondary | ICD-10-CM | POA: Diagnosis not present

## 2020-03-17 DIAGNOSIS — L570 Actinic keratosis: Secondary | ICD-10-CM | POA: Diagnosis not present

## 2020-03-17 DIAGNOSIS — D225 Melanocytic nevi of trunk: Secondary | ICD-10-CM | POA: Diagnosis not present

## 2020-03-17 DIAGNOSIS — D485 Neoplasm of uncertain behavior of skin: Secondary | ICD-10-CM | POA: Diagnosis not present

## 2020-03-17 DIAGNOSIS — L853 Xerosis cutis: Secondary | ICD-10-CM | POA: Diagnosis not present

## 2020-03-17 DIAGNOSIS — L578 Other skin changes due to chronic exposure to nonionizing radiation: Secondary | ICD-10-CM | POA: Diagnosis not present

## 2020-03-17 DIAGNOSIS — Z872 Personal history of diseases of the skin and subcutaneous tissue: Secondary | ICD-10-CM | POA: Diagnosis not present

## 2020-03-17 DIAGNOSIS — L821 Other seborrheic keratosis: Secondary | ICD-10-CM | POA: Diagnosis not present

## 2020-03-17 DIAGNOSIS — Z86018 Personal history of other benign neoplasm: Secondary | ICD-10-CM | POA: Diagnosis not present

## 2020-03-22 ENCOUNTER — Encounter: Payer: Self-pay | Admitting: Family Medicine

## 2020-03-22 ENCOUNTER — Ambulatory Visit (INDEPENDENT_AMBULATORY_CARE_PROVIDER_SITE_OTHER): Payer: PPO | Admitting: Family Medicine

## 2020-03-22 ENCOUNTER — Other Ambulatory Visit: Payer: Self-pay

## 2020-03-22 VITALS — BP 135/74 | HR 70 | Temp 98.4°F | Resp 16 | Wt 171.2 lb

## 2020-03-22 DIAGNOSIS — I10 Essential (primary) hypertension: Secondary | ICD-10-CM | POA: Diagnosis not present

## 2020-03-22 DIAGNOSIS — Z289 Immunization not carried out for unspecified reason: Secondary | ICD-10-CM | POA: Diagnosis not present

## 2020-03-22 DIAGNOSIS — R7303 Prediabetes: Secondary | ICD-10-CM | POA: Diagnosis not present

## 2020-03-22 LAB — POCT GLYCOSYLATED HEMOGLOBIN (HGB A1C)
Est. average glucose Bld gHb Est-mCnc: 126
Hemoglobin A1C: 6 % — AB (ref 4.0–5.6)

## 2020-03-22 MED ORDER — SHINGRIX 50 MCG/0.5ML IM SUSR: 0.5000 mL | Freq: Once | INTRAMUSCULAR | 0 refills | Status: AC

## 2020-03-22 NOTE — Progress Notes (Signed)
Established patient visit   Patient: Alexander Espinoza   DOB: 15-Nov-1940   80 y.o. Male  MRN: [id number] Visit Date: 03/22/2020  Today's healthcare provider: Lelon Huh, MD   Chief Complaint  Patient presents with  . Prediabetes  . Hypertension   Subjective    HPI  Prediabetes, Follow-up  Lab Results  Component Value Date   HGBA1C 6.0 (A) 03/22/2020   HGBA1C 6.9 (H) 11/20/2019   HGBA1C 6.4 (H) 10/21/2018   GLUCOSE 128 (H) 11/20/2019   GLUCOSE 115 (H) 10/21/2018   GLUCOSE 146 (H) 01/23/2017    Last seen for for this4 months ago.  Management since that visit includes starting Metformin ER 500mg  once a day . Current symptoms include none and have been stable.  Prior visit with dietician: no Current diet: well balanced Current exercise: walking  Pertinent Labs:    Component Value Date/Time   CHOL 167 11/20/2019 1508   TRIG 134 11/20/2019 1508   CHOLHDL 3.9 11/20/2019 1508   CREATININE 1.13 11/20/2019 1508    Wt Readings from Last 3 Encounters:  03/22/20 171 lb 3.2 oz (77.7 kg)  11/20/19 179 lb (81.2 kg)  10/21/18 189 lb 3.2 oz (85.8 kg)    -----------------------------------------------------------------------------------------  Hypertension, follow-up  BP Readings from Last 3 Encounters:  03/22/20 135/74  11/20/19 127/75  10/21/18 108/60   Wt Readings from Last 3 Encounters:  03/22/20 171 lb 3.2 oz (77.7 kg)  11/20/19 179 lb (81.2 kg)  10/21/18 189 lb 3.2 oz (85.8 kg)     He was last seen for hypertension 4 months ago.  BP at that visit was 127/75. Management since that visit includes continuing same medication.  He reports good compliance with treatment. He is not having side effects.  He is following a Regular diet. He is exercising. He does not smoke.  Use of agents associated with hypertension: none.   Outside blood pressures are not checked. Symptoms: No chest pain No chest pressure  No palpitations No syncope  No dyspnea No  orthopnea  No paroxysmal nocturnal dyspnea No lower extremity edema   Pertinent labs: Lab Results  Component Value Date   CHOL 167 11/20/2019   HDL 43 11/20/2019   LDLCALC 100 (H) 11/20/2019   TRIG 134 11/20/2019   CHOLHDL 3.9 11/20/2019   Lab Results  Component Value Date   NA 140 11/20/2019   K 4.5 11/20/2019   CREATININE 1.13 11/20/2019   GFRNONAA 63 11/20/2019   GFRAA 73 11/20/2019   GLUCOSE 128 (H) 11/20/2019     The 10-year ASCVD risk score Mikey Bussing DC Jr., et al., 2013) is: 34.2%   ---------------------------------------------------------------------------------------------------    Medications: Outpatient Medications Prior to Visit  Medication Sig  . acetaminophen (TYLENOL) 500 MG tablet Take 2 tablets (1,000 mg total) by mouth every 6 (six) hours as needed for moderate pain.  Marland Kitchen amLODipine (NORVASC) 5 MG tablet TAKE 1 TABLET(5 MG) BY MOUTH DAILY  . latanoprost (XALATAN) 0.005 % ophthalmic solution INSTILL 1 DROP IN OU QHS  . losartan-hydrochlorothiazide (HYZAAR) 100-25 MG tablet TAKE 1 TABLET BY MOUTH DAILY  . metFORMIN (GLUCOPHAGE-XR) 500 MG 24 hr tablet Take 1 tablet (500 mg total) by mouth daily with breakfast.  . metoprolol succinate (TOPROL-XL) 100 MG 24 hr tablet TAKE 1 TABLET BY MOUTH DAILY  . tadalafil (CIALIS) 5 MG tablet Take 1 tablet (5 mg total) by mouth daily.   No facility-administered medications prior to visit.    Review of Systems  Constitutional: Negative for appetite change, chills and fever.  Respiratory: Negative for chest tightness, shortness of breath and wheezing.   Cardiovascular: Negative for chest pain and palpitations.  Gastrointestinal: Negative for abdominal pain, nausea and vomiting.      Objective    BP 135/74 (BP Location: Left Arm, Patient Position: Sitting, Cuff Size: Normal)   Pulse 70   Temp 98.4 F (36.9 C) (Temporal)   Resp 16   Wt 171 lb 3.2 oz (77.7 kg)   BMI 25.28 kg/m     Physical Exam   General appearance:   Well developed, well nourished male, cooperative and in no acute distress Head: Normocephalic, without obvious abnormality, atraumatic Respiratory: Respirations even and unlabored, normal respiratory rate Extremities: All extremities are intact.  Skin: Skin color, texture, turgor normal. No rashes seen  Psych: Appropriate mood and affect. Neurologic: Mental status: Alert, oriented to person, place, and time, thought content appropriate.   Results for orders placed or performed in visit on 03/22/20  POCT HgB A1C  Result Value Ref Range   Hemoglobin A1C 6.0 (A) 4.0 - 5.6 %   Est. average glucose Bld gHb Est-mCnc 126     Assessment & Plan     1. Prediabetes Doing very well since starting metformin last fall. Continue current medications.  Follow up 6 months.   2. Prescription for Shingrix. Vaccine not administered in office.   - Zoster Vaccine Adjuvanted [facility]) injection; Inject 0.5 mLs into the muscle once for 1 dose.  Dispense: 0.5 mL; Refill: 0  3. Primary hypertension Well controlled.  Continue current medications.          The entirety of the information documented in the History of Present Illness, Review of Systems and Physical Exam were personally obtained by me. Portions of this information were initially documented by the CMA and reviewed by me for thoroughness and accuracy.      Lelon Huh, MD  [facility] [phone] (phone) [phone] (fax)  [place]

## 2020-03-28 ENCOUNTER — Encounter: Payer: Self-pay | Admitting: Family Medicine

## 2020-03-28 DIAGNOSIS — C490 Malignant neoplasm of connective and soft tissue of head, face and neck: Secondary | ICD-10-CM | POA: Diagnosis not present

## 2020-04-18 DIAGNOSIS — Z48817 Encounter for surgical aftercare following surgery on the skin and subcutaneous tissue: Secondary | ICD-10-CM | POA: Diagnosis not present

## 2020-04-21 ENCOUNTER — Other Ambulatory Visit: Payer: Self-pay | Admitting: Family Medicine

## 2020-04-21 DIAGNOSIS — R7303 Prediabetes: Secondary | ICD-10-CM

## 2020-04-25 DIAGNOSIS — L570 Actinic keratosis: Secondary | ICD-10-CM | POA: Diagnosis not present

## 2020-05-25 ENCOUNTER — Other Ambulatory Visit: Payer: Self-pay | Admitting: Family Medicine

## 2020-05-31 DIAGNOSIS — R251 Tremor, unspecified: Secondary | ICD-10-CM | POA: Diagnosis not present

## 2020-05-31 DIAGNOSIS — R259 Unspecified abnormal involuntary movements: Secondary | ICD-10-CM | POA: Diagnosis not present

## 2020-08-01 DIAGNOSIS — H6123 Impacted cerumen, bilateral: Secondary | ICD-10-CM | POA: Diagnosis not present

## 2020-08-01 DIAGNOSIS — H903 Sensorineural hearing loss, bilateral: Secondary | ICD-10-CM | POA: Diagnosis not present

## 2020-08-01 DIAGNOSIS — H90A22 Sensorineural hearing loss, unilateral, left ear, with restricted hearing on the contralateral side: Secondary | ICD-10-CM | POA: Diagnosis not present

## 2020-09-21 DIAGNOSIS — Z86018 Personal history of other benign neoplasm: Secondary | ICD-10-CM | POA: Diagnosis not present

## 2020-09-21 DIAGNOSIS — Z872 Personal history of diseases of the skin and subcutaneous tissue: Secondary | ICD-10-CM | POA: Diagnosis not present

## 2020-09-21 DIAGNOSIS — D485 Neoplasm of uncertain behavior of skin: Secondary | ICD-10-CM | POA: Diagnosis not present

## 2020-09-21 DIAGNOSIS — D225 Melanocytic nevi of trunk: Secondary | ICD-10-CM | POA: Diagnosis not present

## 2020-09-21 DIAGNOSIS — L570 Actinic keratosis: Secondary | ICD-10-CM | POA: Diagnosis not present

## 2020-09-21 DIAGNOSIS — L578 Other skin changes due to chronic exposure to nonionizing radiation: Secondary | ICD-10-CM | POA: Diagnosis not present

## 2020-09-23 ENCOUNTER — Ambulatory Visit (INDEPENDENT_AMBULATORY_CARE_PROVIDER_SITE_OTHER): Payer: PPO | Admitting: Family Medicine

## 2020-09-23 ENCOUNTER — Encounter: Payer: Self-pay | Admitting: Family Medicine

## 2020-09-23 ENCOUNTER — Other Ambulatory Visit: Payer: Self-pay

## 2020-09-23 VITALS — BP 106/67 | HR 62 | Temp 98.1°F | Wt 155.0 lb

## 2020-09-23 DIAGNOSIS — R251 Tremor, unspecified: Secondary | ICD-10-CM | POA: Diagnosis not present

## 2020-09-23 DIAGNOSIS — Z23 Encounter for immunization: Secondary | ICD-10-CM | POA: Diagnosis not present

## 2020-09-23 DIAGNOSIS — R7303 Prediabetes: Secondary | ICD-10-CM | POA: Diagnosis not present

## 2020-09-23 DIAGNOSIS — R634 Abnormal weight loss: Secondary | ICD-10-CM | POA: Diagnosis not present

## 2020-09-23 DIAGNOSIS — I10 Essential (primary) hypertension: Secondary | ICD-10-CM | POA: Diagnosis not present

## 2020-09-23 LAB — POCT GLYCOSYLATED HEMOGLOBIN (HGB A1C): Hemoglobin A1C: 5.9 % — AB (ref 4.0–5.6)

## 2020-09-23 NOTE — Progress Notes (Signed)
Established patient visit   Patient: Alexander Espinoza   DOB: 1940/12/04   80 y.o. Male  MRN: [id number] Visit Date: 09/23/2020  Today's healthcare provider: Lelon Huh, MD   Chief Complaint  Patient presents with   Hypertension   Hyperglycemia   Weight Loss    Subjective    HPI  Hypertension, follow-up  BP Readings from Last 3 Encounters:  09/23/20 106/67  03/22/20 135/74  11/20/19 127/75   Wt Readings from Last 3 Encounters:  09/23/20 155 lb (70.3 kg)  03/22/20 171 lb 3.2 oz (77.7 kg)  11/20/19 179 lb (81.2 kg)     He was last seen for hypertension 6 months ago.  BP at that visit was 135/74. Management since that visit includes no changes.  He reports excellent compliance with treatment. He is not having side effects.  He is following a Regular diet. He is exercising. He does not smoke.  Use of agents associated with hypertension: none.   Outside blood pressures are not being checked. Symptoms: No chest pain No chest pressure  No palpitations No syncope  No dyspnea No orthopnea  No paroxysmal nocturnal dyspnea No lower extremity edema   Pertinent labs: Lab Results  Component Value Date   CHOL 167 11/20/2019   HDL 43 11/20/2019   LDLCALC 100 (H) 11/20/2019   TRIG 134 11/20/2019   CHOLHDL 3.9 11/20/2019   Lab Results  Component Value Date   NA 140 11/20/2019   K 4.5 11/20/2019   CREATININE 1.13 11/20/2019   GFRNONAA 63 11/20/2019   GFRAA 73 11/20/2019   GLUCOSE 128 (H) 11/20/2019     The 10-year ASCVD risk score (Arnett DK, et al., 2019) is: 23.8%   ---------------------------------------------------------------------------------------------------  Prediabetes, Follow-up  Lab Results  Component Value Date   HGBA1C 6.0 (A) 03/22/2020   HGBA1C 6.9 (H) 11/20/2019   HGBA1C 6.4 (H) 10/21/2018   GLUCOSE 128 (H) 11/20/2019   GLUCOSE 115 (H) 10/21/2018   GLUCOSE 146 (H) 01/23/2017    Last seen for for this6 months ago.  Management  since that visit includes no changes. Current symptoms include none and have been stable.  Current diet: in general, a "healthy" diet   Current exercise: walking    Wt Readings from Last 3 Encounters:  09/23/20 155 lb (70.3 kg)  03/22/20 171 lb 3.2 oz (77.7 kg)  11/20/19 179 lb (81.2 kg)    -----------------------------------------------------------------------------------------  Lab Results  Component Value Date   PSA1 0.4 11/20/2019   PSA1 0.5 10/21/2018   PSA1 0.4 01/23/2017   PSA 0.3 11/11/2013       Medications: Outpatient Medications Prior to Visit  Medication Sig   acetaminophen (TYLENOL) 500 MG tablet Take 2 tablets (1,000 mg total) by mouth every 6 (six) hours as needed for moderate pain.   amLODipine (NORVASC) 5 MG tablet TAKE 1 TABLET(5 MG) BY MOUTH DAILY   latanoprost (XALATAN) 0.005 % ophthalmic solution INSTILL 1 DROP IN OU QHS   losartan-hydrochlorothiazide (HYZAAR) 100-25 MG tablet TAKE 1 TABLET BY MOUTH DAILY   metFORMIN (GLUCOPHAGE-XR) 500 MG 24 hr tablet TAKE 1 TABLET(500 MG) BY MOUTH DAILY WITH BREAKFAST   metoprolol succinate (TOPROL-XL) 100 MG 24 hr tablet TAKE 1 TABLET BY MOUTH DAILY   tadalafil (CIALIS) 5 MG tablet Take 1 tablet (5 mg total) by mouth daily.   No facility-administered medications prior to visit.    Review of Systems  Constitutional:  Positive for unexpected weight change. Negative for activity  change, appetite change, chills, diaphoresis, fatigue and fever.  Respiratory: Negative.    Cardiovascular: Negative.   Gastrointestinal: Negative.   Neurological:  Negative for dizziness, weakness, light-headedness and headaches.       Objective    BP 106/67 (BP Location: Right Arm, Patient Position: Sitting, Cuff Size: Normal)   Pulse 62   Temp 98.1 F (36.7 C) (Oral)   Wt 155 lb (70.3 kg)   SpO2 100%   BMI 22.89 kg/m     Physical Exam   General: Appearance:    Well developed, well nourished male in no acute distress  Eyes:     PERRL, conjunctiva/corneas clear, EOM's intact       Lungs:     Clear to auscultation bilaterally, respirations unlabored  Heart:    Normal heart rate. Normal rhythm. No murmurs, rubs, or gallops.    MS:   All extremities are intact.    Neurologic:   Awake, alert, oriented x 3. No apparent focal neurological defect.         Results for orders placed or performed in visit on 09/23/20  POCT glycosylated hemoglobin (Hb A1C)  Result Value Ref Range   Hemoglobin A1C 5.9 (A) 4.0 - 5.6 %    Assessment & Plan     1. Prediabetes Very well controlled on metformin, but has lost about 25 pounds over the last year. Will put metformin on hold for the time being and follow up for AWV and weight check in 3 months.   2. Primary hypertension Well controlled.  Continue current medications.   - Comprehensive metabolic panel - CBC with Differential/Platelet  3. Abnormal weight loss Possible due to metformin which is being put on hold today.   4. Tremor Improved since Dr. Manuella Ghazi started on Sinemet. Has neurology follow up scheduled in November.   5. Need for influenza vaccination  - Flu Vaccine QUAD High Dose(Fluad)   Future Appointments  Date Time Provider [place]  02/06/2021  2:00 PM Merrick Feutz, Kirstie Peri, MD [facility]         The entirety of the information documented in the History of Present Illness, Review of Systems and Physical Exam were personally obtained by me. Portions of this information were initially documented by the CMA and reviewed by me for thoroughness and accuracy.     Lelon Huh, MD  [facility] [phone] (phone) [phone] (fax)  [place]

## 2020-09-23 NOTE — Patient Instructions (Addendum)
Please review the attached list of medications and notify my office if there are any errors.   I strongly recommend a Covid Omicron booster if it's been more than 2 months since your last covid booster or infection.    Put metformin on hold for now.

## 2020-09-24 LAB — CBC WITH DIFFERENTIAL/PLATELET
Basophils Absolute: 0.1 10*3/uL (ref 0.0–0.2)
Basos: 1 %
EOS (ABSOLUTE): 0.2 10*3/uL (ref 0.0–0.4)
Eos: 2 %
Hematocrit: 43.4 % (ref 37.5–51.0)
Hemoglobin: 14.2 g/dL (ref 13.0–17.7)
Immature Grans (Abs): 0 10*3/uL (ref 0.0–0.1)
Immature Granulocytes: 0 %
Lymphocytes Absolute: 1.8 10*3/uL (ref 0.7–3.1)
Lymphs: 16 %
MCH: 29.6 pg (ref 26.6–33.0)
MCHC: 32.7 g/dL (ref 31.5–35.7)
MCV: 90 fL (ref 79–97)
Monocytes Absolute: 1 10*3/uL — ABNORMAL HIGH (ref 0.1–0.9)
Monocytes: 9 %
Neutrophils Absolute: 7.8 10*3/uL — ABNORMAL HIGH (ref 1.4–7.0)
Neutrophils: 72 %
Platelets: 325 10*3/uL (ref 150–450)
RBC: 4.8 x10E6/uL (ref 4.14–5.80)
RDW: 12.9 % (ref 11.6–15.4)
WBC: 10.9 10*3/uL — ABNORMAL HIGH (ref 3.4–10.8)

## 2020-09-24 LAB — COMPREHENSIVE METABOLIC PANEL
ALT: 15 IU/L (ref 0–44)
AST: 14 IU/L (ref 0–40)
Albumin/Globulin Ratio: 1.7 (ref 1.2–2.2)
Albumin: 4.3 g/dL (ref 3.7–4.7)
Alkaline Phosphatase: 87 IU/L (ref 44–121)
BUN/Creatinine Ratio: 20 (ref 10–24)
BUN: 19 mg/dL (ref 8–27)
Bilirubin Total: 0.5 mg/dL (ref 0.0–1.2)
CO2: 29 mmol/L (ref 20–29)
Calcium: 9.9 mg/dL (ref 8.6–10.2)
Chloride: 98 mmol/L (ref 96–106)
Creatinine, Ser: 0.96 mg/dL (ref 0.76–1.27)
Globulin, Total: 2.6 g/dL (ref 1.5–4.5)
Glucose: 109 mg/dL — ABNORMAL HIGH (ref 65–99)
Potassium: 4.5 mmol/L (ref 3.5–5.2)
Sodium: 141 mmol/L (ref 134–144)
Total Protein: 6.9 g/dL (ref 6.0–8.5)
eGFR: 81 mL/min/{1.73_m2} (ref >59)

## 2020-09-30 ENCOUNTER — Other Ambulatory Visit: Payer: Self-pay | Admitting: Family Medicine

## 2020-09-30 DIAGNOSIS — R7303 Prediabetes: Secondary | ICD-10-CM

## 2020-10-01 NOTE — Telephone Encounter (Signed)
Requested medications are due for refill today requesting NO  Requested medications are on the active medication list NO  Last refill 07/26/20  Last visit 09/23/20  Future visit scheduled 02/06/21  Notes to clinic Not on current med list.

## 2020-11-28 ENCOUNTER — Other Ambulatory Visit: Payer: Self-pay

## 2020-11-28 ENCOUNTER — Telehealth: Payer: Self-pay | Admitting: Family Medicine

## 2020-11-28 MED ORDER — LOSARTAN POTASSIUM-HCTZ 100-25 MG PO TABS: 1.0000 | ORAL_TABLET | Freq: Every day | ORAL | 1 refills | Status: DC

## 2020-11-28 NOTE — Telephone Encounter (Signed)
[place] faxed refill request for the following medications:  losartan-hydrochlorothiazide (HYZAAR) 100-25 MG tablet   Please advise.

## 2021-01-16 ENCOUNTER — Other Ambulatory Visit: Payer: Self-pay | Admitting: Family Medicine

## 2021-01-16 DIAGNOSIS — I10 Essential (primary) hypertension: Secondary | ICD-10-CM

## 2021-02-06 ENCOUNTER — Other Ambulatory Visit: Payer: Self-pay

## 2021-02-06 ENCOUNTER — Ambulatory Visit (INDEPENDENT_AMBULATORY_CARE_PROVIDER_SITE_OTHER): Payer: PPO | Admitting: Family Medicine

## 2021-02-06 ENCOUNTER — Encounter: Payer: Self-pay | Admitting: Family Medicine

## 2021-02-06 VITALS — BP 139/78 | HR 63 | Temp 98.7°F | Resp 16 | Ht 69.0 in | Wt 157.0 lb

## 2021-02-06 DIAGNOSIS — R7303 Prediabetes: Secondary | ICD-10-CM | POA: Diagnosis not present

## 2021-02-06 DIAGNOSIS — Z0000 Encounter for general adult medical examination without abnormal findings: Secondary | ICD-10-CM

## 2021-02-06 DIAGNOSIS — I10 Essential (primary) hypertension: Secondary | ICD-10-CM | POA: Diagnosis not present

## 2021-02-06 DIAGNOSIS — B351 Tinea unguium: Secondary | ICD-10-CM

## 2021-02-06 MED ORDER — TERBINAFINE HCL 250 MG PO TABS: 250.0000 mg | ORAL_TABLET | Freq: Every day | ORAL | 0 refills | Status: DC

## 2021-02-06 NOTE — Progress Notes (Signed)
Annual Wellness Visit     Patient: Alexander Espinoza, Male    DOB: 08-26-1940, 81 y.o.   MRN: [id number] Visit Date: 02/06/2021  Today's Provider: Lelon Huh, MD   Chief Complaint  Patient presents with   Medicare Wellness   Subjective    Alexander Espinoza is a 81 y.o. male who presents today for his Annual Wellness Visit.   Medications: Outpatient Medications Prior to Visit  Medication Sig   acetaminophen (TYLENOL) 500 MG tablet Take 2 tablets (1,000 mg total) by mouth every 6 (six) hours as needed for moderate pain.   amLODipine (NORVASC) 5 MG tablet TAKE 1 TABLET(5 MG) BY MOUTH DAILY   carbidopa-levodopa (SINEMET IR) 25-100 MG tablet Take by mouth 3 (three) times daily.   latanoprost (XALATAN) 0.005 % ophthalmic solution INSTILL 1 DROP IN OU QHS   losartan-hydrochlorothiazide (HYZAAR) 100-25 MG tablet Take 1 tablet by mouth daily.   metoprolol succinate (TOPROL-XL) 100 MG 24 hr tablet TAKE 1 TABLET BY MOUTH DAILY   tadalafil (CIALIS) 5 MG tablet Take 1 tablet (5 mg total) by mouth daily.   No facility-administered medications prior to visit.    No Known Allergies  Patient Care Team: Birdie Sons, MD as PCP - General (Family Medicine) Odette Fraction as Consulting Physician (Optometry) Ree Edman, MD as Consulting Physician (Dermatology) Vladimir Crofts, MD as Consulting Physician (Neurology) Fransisca Connors as Consulting Physician (Dermatology)  Review of Systems      Objective     Most recent functional status assessment: In your present state of health, do you have any difficulty performing the following activities: 02/06/2021  Hearing? N  Vision? N  Difficulty concentrating or making decisions? N  Walking or climbing stairs? N  Dressing or bathing? N  Doing errands, shopping? N  Some recent data might be hidden   Most recent fall risk assessment: Fall Risk  02/06/2021  Falls in the past year? 0  Comment -  Number falls in past  yr: 0  Injury with Fall? 0  Risk for fall due to : No Fall Risks  Follow up Falls evaluation completed    Most recent depression screenings: PHQ 2/9 Scores 02/06/2021 09/23/2020  PHQ - 2 Score 0 0  PHQ- 9 Score 0 0   Most recent cognitive screening: 6CIT Screen 01/19/2016  What Year? 0 points  What month? 0 points  What time? 0 points  Count back from 20 0 points  Months in reverse 0 points  Repeat phrase 6 points  Total Score 6   Most recent Audit-C alcohol use screening Alcohol Use Disorder Test (AUDIT) 02/06/2021  1. How often do you have a drink containing alcohol? 2  2. How many drinks containing alcohol do you have on a typical day when you are drinking? 0  3. How often do you have six or more drinks on one occasion? 0  AUDIT-C Score 2  Alcohol Brief Interventions/Follow-up -   A score of 3 or more in women, and 4 or more in men indicates increased risk for alcohol abuse, EXCEPT if all of the points are from question 1   No results found for any visits on 02/06/21.  Assessment & Plan     Annual wellness visit done today including the all of the following: Reviewed patient's Family Medical History Reviewed and updated list of patient's medical providers Assessment of cognitive impairment was done Assessed patient's functional ability Established a written schedule for health  screening services Health Risk Assessent Completed and Reviewed  Exercise Activities and Dietary recommendations  Goals      DIET - INCREASE WATER INTAKE     Recommend increasing water intake to 6 glasses or more a day.      Increase water intake     Starting 01/19/16, I will increase my water intake to 6 glasses a day.        Immunization History  Administered Date(s) Administered   Fluad Quad(high Dose 65+) 09/23/2020   Influenza, High Dose Seasonal PF 09/10/2017, 10/28/2019   Influenza-Unspecified 11/03/2014, 12/22/2016   PFIZER(Purple Top)SARS-COV-2 Vaccination 02/01/2019,  02/23/2019, 11/20/2019   Pneumococcal Conjugate-13 04/23/2013   Pneumococcal Polysaccharide-23 01/11/2011, 01/11/2012    Health Maintenance  Topic Date Due   Hepatitis C Screening  Never done   Zoster Vaccines- Shingrix (1 of 2) Never done   COVID-19 Vaccine (4 - Booster for Pfizer series) 01/15/2020   TETANUS/TDAP  11/05/2022   Pneumonia Vaccine 63+ Years old  Completed   INFLUENZA VACCINE  Completed   HPV VACCINES  Aged Out   COLONOSCOPY (Pts 45-59yrs Insurance coverage will need to be confirmed)  Discontinued     Discussed health benefits of physical activity, and encouraged him to engage in regular exercise appropriate for his age and condition.        The entirety of the information documented in the History of Present Illness, Review of Systems and Physical Exam were personally obtained by me. Portions of this information were initially documented by the CMA and reviewed by me for thoroughness and accuracy.     Lelon Huh, MD  [facility] [phone] (phone) [phone] (fax)  [place]

## 2021-02-06 NOTE — Patient Instructions (Signed)
Please review the attached list of medications and notify my office if there are any errors.   Please bring all of your medications to every appointment so we can make sure that our medication list is the same as yours.   You''ll need to take the terbinafine for 3-6 months to get a new normal toenail to grow in.  We'll need to check you liver functions about 4 weeks after staring terbinafine to make sure it does not affect your liver enzymes.   Let me know if you need a referral to a podiatrist to cut of the infected nail

## 2021-02-06 NOTE — Progress Notes (Signed)
Complete Physical Exam    I,April Miller,acting as a scribe for Lelon Huh, MD.,have documented all relevant documentation on the behalf of Lelon Huh, MD,as directed by  Lelon Huh, MD while in the presence of Lelon Huh, MD.   Patient: Alexander Espinoza, Male    DOB: 01-08-1941, 81 y.o.   MRN: [id number] Visit Date: 02/06/2021  Today's Provider: Lelon Huh, MD   Chief Complaint  Patient presents with   Medicare Wellness   Subjective    Alexander Espinoza is a 81 y.o. male who presents today for his complete physical examination. He reports consuming a general and low sodium diet. Home exercise routine includes walking. He generally feels fairly well. He reports sleeping fairly well. He does not have additional problems to discuss today.   HPI He is also concerned about deformed right great toenail which is starting hanging on on small thick strip near the nail matrix. Although it is not painful or bothersome.    Medications: Outpatient Medications Prior to Visit  Medication Sig   acetaminophen (TYLENOL) 500 MG tablet Take 2 tablets (1,000 mg total) by mouth every 6 (six) hours as needed for moderate pain.   amLODipine (NORVASC) 5 MG tablet TAKE 1 TABLET(5 MG) BY MOUTH DAILY   carbidopa-levodopa (SINEMET IR) 25-100 MG tablet Take by mouth 3 (three) times daily.   latanoprost (XALATAN) 0.005 % ophthalmic solution INSTILL 1 DROP IN OU QHS   losartan-hydrochlorothiazide (HYZAAR) 100-25 MG tablet Take 1 tablet by mouth daily.   metoprolol succinate (TOPROL-XL) 100 MG 24 hr tablet TAKE 1 TABLET BY MOUTH DAILY   tadalafil (CIALIS) 5 MG tablet Take 1 tablet (5 mg total) by mouth daily.   No facility-administered medications prior to visit.    No Known Allergies  Patient Care Team: Birdie Sons, MD as PCP - General (Family Medicine) Odette Fraction as Consulting Physician (Optometry) Ree Edman, MD as Consulting Physician (Dermatology) Vladimir Crofts, MD as Consulting Physician (Neurology) Fransisca Connors as Consulting Physician (Dermatology)  Review of Systems  Genitourinary:  Positive for frequency.  All other systems reviewed and are negative.      Objective    Vitals: BP 139/78 (BP Location: Right Arm, Patient Position: Sitting, Cuff Size: Normal)    Pulse 63    Temp 98.7 F (37.1 C) (Temporal)    Resp 16    Ht 5\' 9"  (1.753 m)    Wt 157 lb (71.2 kg)    SpO2 98%    BMI 23.18 kg/m    Physical Exam   General Appearance:    Well developed, well nourished male. Alert, cooperative, in no acute distress, appears stated age  Head:    Normocephalic, without obvious abnormality, atraumatic  Eyes:    PERRL, conjunctiva/corneas clear, EOM's intact, fundi    benign, both eyes       Ears:    Normal TM's and external ear canals, both ears  Nose:   Nares normal, septum midline, mucosa normal, no drainage   or sinus tenderness  Throat:   Lips, mucosa, and tongue normal; teeth and gums normal  Neck:   Supple, symmetrical, trachea midline, no adenopathy;       thyroid:  No enlargement/tenderness/nodules; no carotid   bruit or JVD  Back:     Symmetric, no curvature, ROM normal, no CVA tenderness  Lungs:     Clear to auscultation bilaterally, respirations unlabored  Chest wall:    No tenderness or  deformity  Heart:    Normal heart rate. Normal rhythm. No murmurs, rubs, or gallops.  S1 and S2 normal  Abdomen:     Soft, non-tender, bowel sounds active all four quadrants,    no masses, no organomegaly  Genitalia:    deferred  Rectal:    deferred  Extremities:   All extremities are intact. No cyanosis or edema  Pulses:   2+ and symmetric all extremities  Skin:   Skin color, texture, turgor normal, no rashes or lesions Right great toenail deformed, very thick, yellow crooked and partially separated from nail matrix, c/w onychomycosis.   Neurologic:   CNII-XII intact. Normal strength, sensation and reflexes      throughout      Assessment & Plan    1. Annual physical exam   2. Onychomycosis Start terbinafine (LAMISIL) 250 MG tablet; Take 1 tablet (250 mg total) by mouth daily.  Dispense: 30 tablet; Refill: 0  Will check LFTs only with routine labs in 4 weeks.   3. Prediabetes Will check a1c with LFTs in 4 weeks.   4. Primary hypertension Well controlled.  Continue current medications.   - EKG 12-Lead    The entirety of the information documented in the History of Present Illness, Review of Systems and Physical Exam were personally obtained by me. Portions of this information were initially documented by the CMA and reviewed by me for thoroughness and accuracy.     Lelon Huh, MD  [facility] [phone] (phone) [phone] (fax)  [place]

## 2021-03-09 ENCOUNTER — Telehealth: Payer: Self-pay | Admitting: Family Medicine

## 2021-03-09 DIAGNOSIS — B351 Tinea unguium: Secondary | ICD-10-CM

## 2021-03-09 NOTE — Telephone Encounter (Signed)
Patient is due to check liver functions since starting Lamisil last month to make sure it is safe to continue taking it. Please advise and print up lab order for him. Thanks! ?

## 2021-03-09 NOTE — Telephone Encounter (Signed)
Tried calling patient. Left message to call back. OK for [facility] triage to advise.  ?

## 2021-03-10 NOTE — Telephone Encounter (Signed)
Patient called and advised of the message below from Dr. Caryn Section, patient verbalized understanding. ?

## 2021-03-13 DIAGNOSIS — R251 Tremor, unspecified: Secondary | ICD-10-CM | POA: Diagnosis not present

## 2021-03-13 DIAGNOSIS — R259 Unspecified abnormal involuntary movements: Secondary | ICD-10-CM | POA: Diagnosis not present

## 2021-03-13 DIAGNOSIS — G4733 Obstructive sleep apnea (adult) (pediatric): Secondary | ICD-10-CM | POA: Diagnosis not present

## 2021-03-14 DIAGNOSIS — B351 Tinea unguium: Secondary | ICD-10-CM | POA: Diagnosis not present

## 2021-03-15 ENCOUNTER — Other Ambulatory Visit: Payer: Self-pay | Admitting: Family Medicine

## 2021-03-15 DIAGNOSIS — B351 Tinea unguium: Secondary | ICD-10-CM

## 2021-03-15 LAB — HEPATIC FUNCTION PANEL
ALT: 11 IU/L (ref 0–44)
AST: 20 IU/L (ref 0–40)
Albumin: 4.4 g/dL (ref 3.7–4.7)
Alkaline Phosphatase: 86 IU/L (ref 44–121)
Bilirubin Total: 0.4 mg/dL (ref 0.0–1.2)
Bilirubin, Direct: 0.12 mg/dL (ref 0.00–0.40)
Total Protein: 7.2 g/dL (ref 6.0–8.5)

## 2021-03-15 MED ORDER — TERBINAFINE HCL 250 MG PO TABS: 250.0000 mg | ORAL_TABLET | Freq: Every day | ORAL | 4 refills | Status: DC

## 2021-04-03 DIAGNOSIS — I10 Essential (primary) hypertension: Secondary | ICD-10-CM | POA: Diagnosis not present

## 2021-04-03 DIAGNOSIS — Z87891 Personal history of nicotine dependence: Secondary | ICD-10-CM | POA: Diagnosis not present

## 2021-04-03 DIAGNOSIS — G20 Parkinson's disease: Secondary | ICD-10-CM | POA: Diagnosis not present

## 2021-04-21 IMAGING — MR MR HEAD W/O CM
3 series · 48 of 48 positions shown · non-contrast
Comparison: None.

CLINICAL DATA: Tremor in both hands.

EXAM:
MRI HEAD WITHOUT CONTRAST
TECHNIQUE: Multiplanar, multiecho pulse sequences of the brain and surrounding
structures were obtained without intravenous contrast.
Additionally, using NeuroQuant software a 3D volumetric analysis of
the brain was performed and is compared to a normative database
adjusted for age, gender and intracranial volume.

[Series 52: nqsegcb_sc_cor · 1.00mm/px · 17 of 232 slices shown]
[im 1/232]
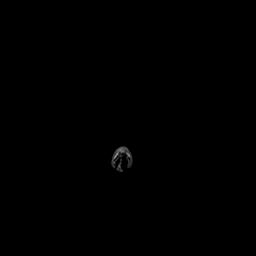
[im 15/232]
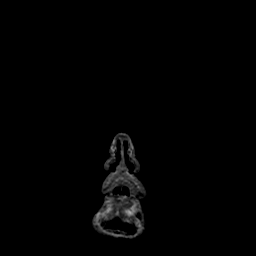
[im 29/232]
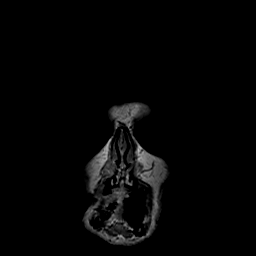
[im 44/232]
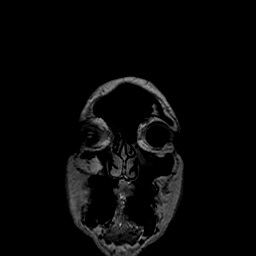
[im 58/232]
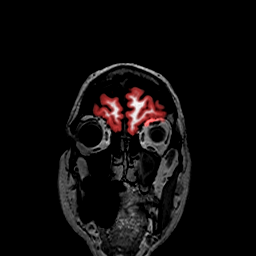
[im 73/232]
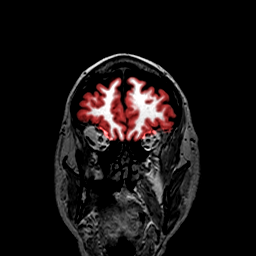
[im 87/232]
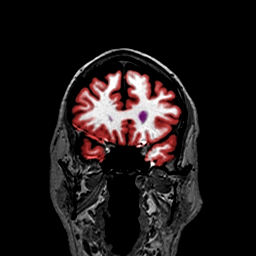
[im 102/232]
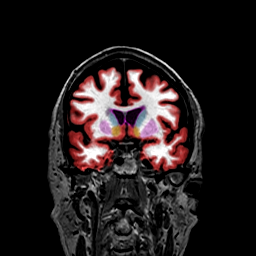
[im 116/232]
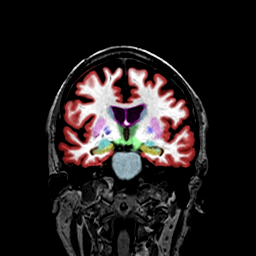
[im 130/232]
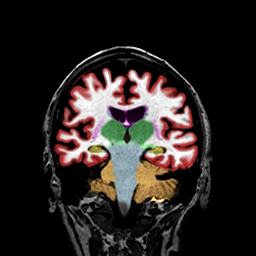
[im 145/232]
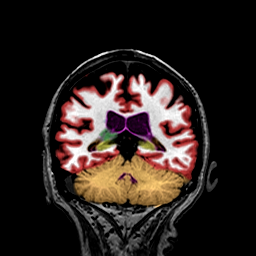
[im 159/232]
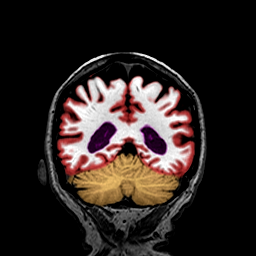
[im 174/232]
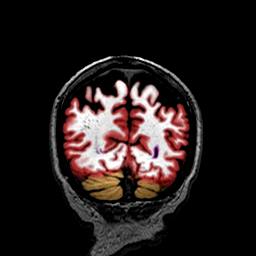
[im 188/232]
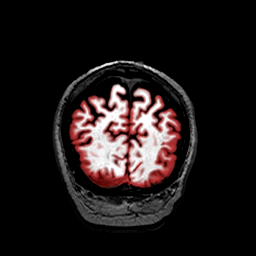
[im 203/232]
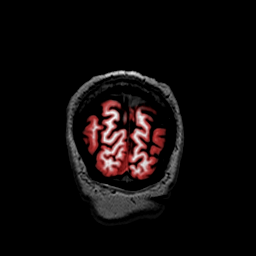
[im 217/232]
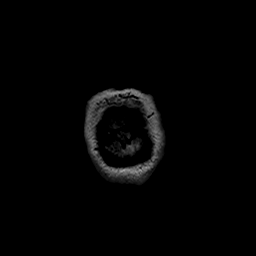
[im 232/232]
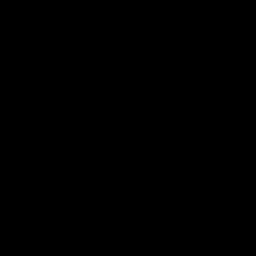

[Series 53: nqsegcb_sc_axl · 1.00mm/px · 16 of 209 slices shown]
[im 1/209]
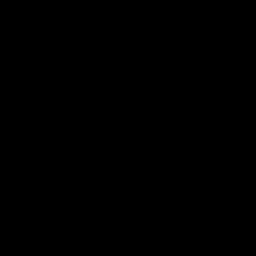
[im 14/209]
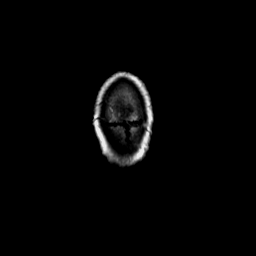
[im 28/209]
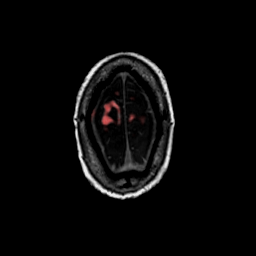
[im 42/209]
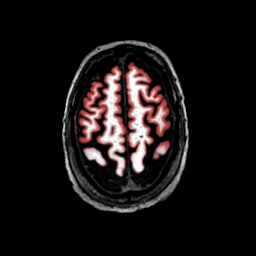
[im 56/209]
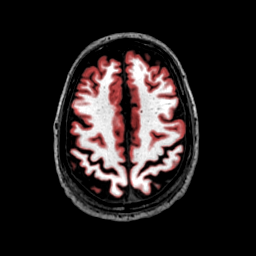
[im 70/209]
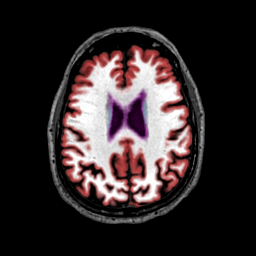
[im 84/209]
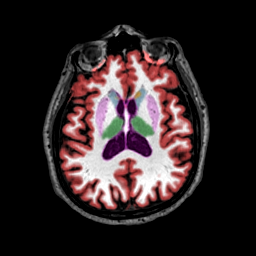
[im 98/209]
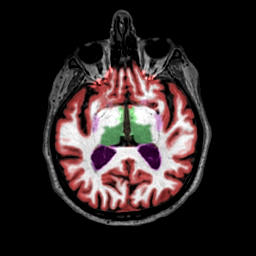
[im 111/209]
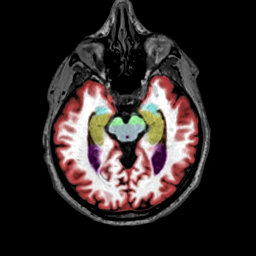
[im 125/209]
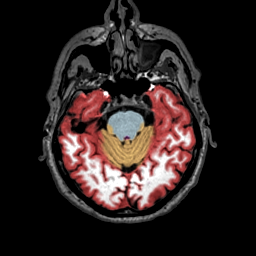
[im 139/209]
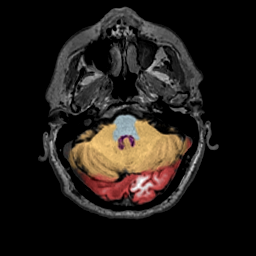
[im 153/209]
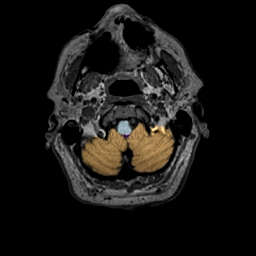
[im 167/209]
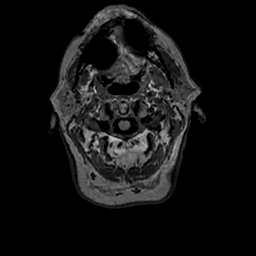
[im 181/209]
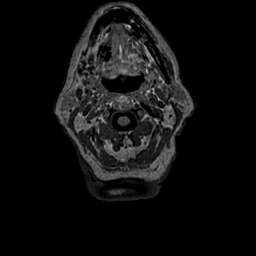
[im 195/209]
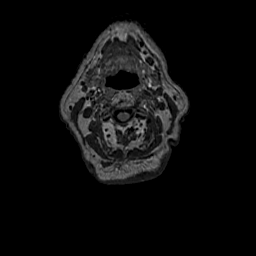
[im 209/209]
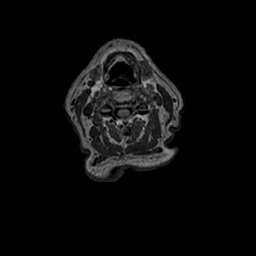

[Series 54: nqsegcb_sc_sag · 1.00mm/px · 15 of 206 slices shown]
[im 1/206]
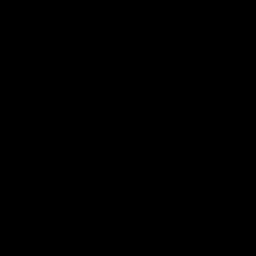
[im 15/206]
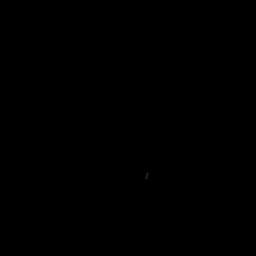
[im 30/206]
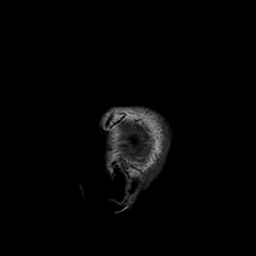
[im 44/206]
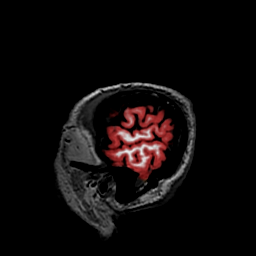
[im 59/206]
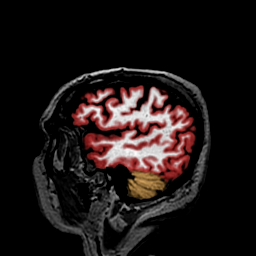
[im 74/206]
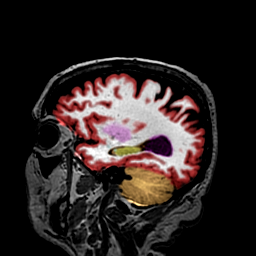
[im 88/206]
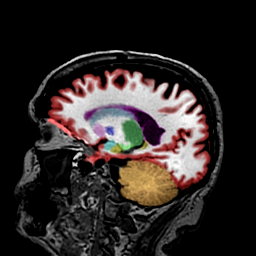
[im 103/206]
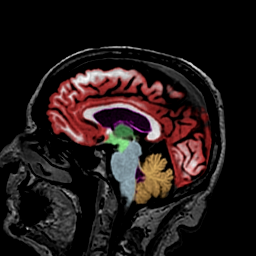
[im 118/206]
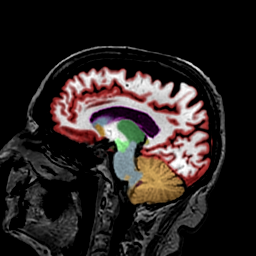
[im 132/206]
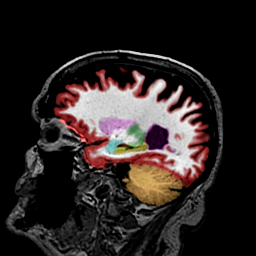
[im 147/206]
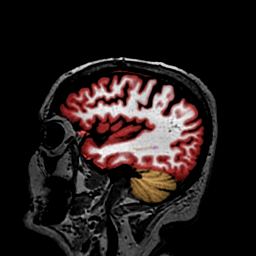
[im 162/206]
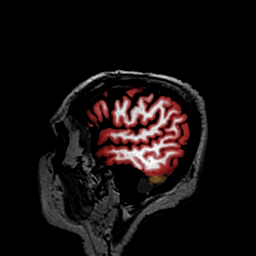
[im 176/206]
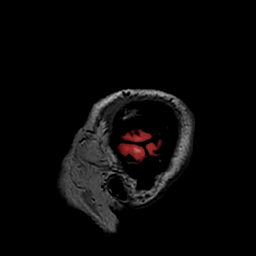
[im 191/206]
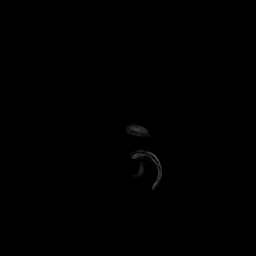
[im 206/206]
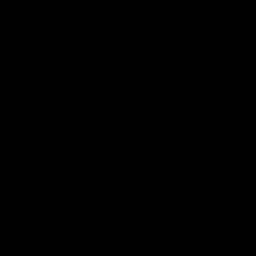

[48 of 48 positions shown; findings below may reference images not displayed]

FINDINGS: Brain: No acute infarction, hemorrhage, hydrocephalus, extra-axial
collection or mass lesion.

Vascular: Normal flow voids.

Skull and upper cervical spine: Normal marrow signal.

Sinuses/Orbits: Opacification of the left maxillary sinus with
inspissated secretion. The orbits are maintained.

NeuroQuant Findings:

Volumetric analysis of the brain was performed, with a fully
detailed report in [HOSPITAL] PACS. Briefly, the comparison with age and
gender matched reference reveals decreased cortical gray matter and
hippocampal volumes in the 19th and 26th normative percentile.
IMPRESSION: 1. No acute intracranial abnormality.
2. NeuroQuant volumetric analysis of the brain, see details on
[HOSPITAL] PACS.
3. Left maxillary sinus disease.

## 2021-06-09 ENCOUNTER — Other Ambulatory Visit: Payer: Self-pay | Admitting: Family Medicine

## 2021-08-18 DIAGNOSIS — H903 Sensorineural hearing loss, bilateral: Secondary | ICD-10-CM | POA: Diagnosis not present

## 2021-08-18 DIAGNOSIS — H6123 Impacted cerumen, bilateral: Secondary | ICD-10-CM | POA: Diagnosis not present

## 2022-01-17 ENCOUNTER — Other Ambulatory Visit: Payer: Self-pay | Admitting: Family Medicine

## 2022-01-17 DIAGNOSIS — I10 Essential (primary) hypertension: Secondary | ICD-10-CM

## 2022-01-18 NOTE — Telephone Encounter (Signed)
Requested Prescriptions  Pending Prescriptions Disp Refills   metoprolol succinate (TOPROL-XL) 100 MG 24 hr tablet [Pharmacy Med Name: METOPROLOL ER SUCCINATE '100MG'$  TABS] 90 tablet 4    Sig: TAKE 1 TABLET BY MOUTH DAILY     Cardiovascular:  Beta Blockers Failed - 01/17/2022  5:33 PM      Failed - Valid encounter within last 6 months    Recent Outpatient Visits           11 months ago Annual physical exam   [facility] Birdie Sons, MD   1 year ago Prediabetes   [facility] Birdie Sons, MD   1 year ago Prediabetes   [facility] Birdie Sons, MD   2 years ago Medicare annual wellness visit, subsequent   [facility] Birdie Sons, MD   3 years ago Annual physical exam   [place], [place]       Future Appointments             In 2 weeks Caryn Section, Kirstie Peri, MD [facility] [place] BP in normal range    BP Readings from Last 1 Encounters:  02/06/21 139/78         Passed - Last Heart Rate in normal range    Pulse Readings from Last 1 Encounters:  02/06/21 63

## 2022-02-02 ENCOUNTER — Encounter: Payer: Self-pay | Admitting: Family Medicine

## 2022-02-02 ENCOUNTER — Ambulatory Visit (INDEPENDENT_AMBULATORY_CARE_PROVIDER_SITE_OTHER): Payer: PPO | Admitting: Family Medicine

## 2022-02-02 VITALS — BP 102/64 | HR 64 | Temp 97.6°F | Ht 69.0 in | Wt 147.2 lb

## 2022-02-02 DIAGNOSIS — R634 Abnormal weight loss: Secondary | ICD-10-CM | POA: Diagnosis not present

## 2022-02-02 DIAGNOSIS — R739 Hyperglycemia, unspecified: Secondary | ICD-10-CM | POA: Diagnosis not present

## 2022-02-02 DIAGNOSIS — I10 Essential (primary) hypertension: Secondary | ICD-10-CM | POA: Diagnosis not present

## 2022-02-02 DIAGNOSIS — E559 Vitamin D deficiency, unspecified: Secondary | ICD-10-CM | POA: Diagnosis not present

## 2022-02-02 DIAGNOSIS — R7303 Prediabetes: Secondary | ICD-10-CM | POA: Diagnosis not present

## 2022-02-02 NOTE — Progress Notes (Unsigned)
I,Connie R Striblin,acting as a Education administrator for Lelon Huh, MD.,have documented all relevant documentation on the behalf of Lelon Huh, MD,as directed by  Lelon Huh, MD while in the presence of Lelon Huh, MD.  Established patient visit   Patient: Alexander Espinoza   DOB: 10/15/1940   82 y.o. Male  MRN: [id number] Visit Date: 02/02/2022  Today's healthcare provider: Lelon Huh, MD   No chief complaint on file.  Subjective    HPI  Fatigue/ weight loss Patient complains of fatigue. Symptoms began several months ago. Sentinal symptom the patient feels fatigue began with: significant change in weight. Patient describes the following psychologic symptoms: none.  Patient denies none. Symptoms have stabilized.  Pt had his teeth removed in November 2023, has had difficulty eating.  Pt has been drinking Boost for weight gain. Pt was 171lb on 03/22/2020 Wt Readings from Last 10 Encounters:  02/02/22 147 lb 3.2 oz (66.8 kg)  02/06/21 157 lb (71.2 kg)  09/23/20 155 lb (70.3 kg)  03/22/20 171 lb 3.2 oz (77.7 kg)  11/20/19 179 lb (81.2 kg)  10/21/18 189 lb 3.2 oz (85.8 kg)  02/20/18 189 lb 3.2 oz (85.8 kg)  01/23/17 195 lb (88.5 kg)  01/22/17 194 lb 12.8 oz (88.4 kg)  08/08/16 198 lb (89.8 kg)    Medications: Outpatient Medications Prior to Visit  Medication Sig   acetaminophen (TYLENOL) 500 MG tablet Take 2 tablets (1,000 mg total) by mouth every 6 (six) hours as needed for moderate pain.   amLODipine (NORVASC) 5 MG tablet TAKE 1 TABLET(5 MG) BY MOUTH DAILY   carbidopa-levodopa (SINEMET IR) 25-100 MG tablet Take by mouth 3 (three) times daily.   latanoprost (XALATAN) 0.005 % ophthalmic solution INSTILL 1 DROP IN OU QHS   losartan-hydrochlorothiazide (HYZAAR) 100-25 MG tablet TAKE 1 TABLET BY MOUTH DAILY   metoprolol succinate (TOPROL-XL) 100 MG 24 hr tablet TAKE 1 TABLET BY MOUTH DAILY   tadalafil (CIALIS) 5 MG tablet Take 1 tablet (5 mg total) by mouth daily.    terbinafine (LAMISIL) 250 MG tablet Take 1 tablet (250 mg total) by mouth daily.   No facility-administered medications prior to visit.    Review of Systems  Constitutional:  Negative for chills, diaphoresis and fever.  HENT:  Negative for congestion, ear discharge, ear pain, hearing loss, nosebleeds, sore throat and tinnitus.   Eyes:  Negative for photophobia, pain, discharge and redness.  Respiratory:  Negative for cough, shortness of breath, wheezing and stridor.   Cardiovascular:  Negative for chest pain, palpitations and leg swelling.  Gastrointestinal:  Negative for abdominal pain, blood in stool, constipation, diarrhea, nausea and vomiting.  Endocrine: Negative for polydipsia.  Genitourinary:  Negative for dysuria, flank pain, frequency, hematuria and urgency.  Musculoskeletal:  Negative for back pain, myalgias and neck pain.  Skin:  Negative for rash.  Allergic/Immunologic: Negative for environmental allergies.  Neurological:  Negative for dizziness, tremors, seizures, weakness and headaches.  Hematological:  Does not bruise/bleed easily.  Psychiatric/Behavioral:  Negative for hallucinations and suicidal ideas. The patient is not nervous/anxious.     {Labs  Heme  Chem  Endocrine  Serology  Results Review (optional):23779}   Objective    BP 102/64 (BP Location: Right Arm, Patient Position: Sitting, Cuff Size: Normal)   Pulse 64   Temp 97.6 F (36.4 C) (Oral)   Ht '5\' 9"'$  (1.753 m)   Wt 147 lb 3.2 oz (66.8 kg)   SpO2 99%   BMI 21.74  kg/m  {Show previous vital signs (optional):23777}  Physical Exam   General: Appearance:    Well developed, well nourished male in no acute distress  Eyes:    PERRL, conjunctiva/corneas clear, EOM's intact       Lungs:     Clear to auscultation bilaterally, respirations unlabored  Heart:    Normal heart rate. Normal rhythm. No murmurs, rubs, or gallops.    MS:   All extremities are intact.    Neurologic:   Awake, alert, oriented x 3. No  apparent focal neurological defect.         Assessment & Plan     1. Abnormal weight loss Likely secondary to dental problems which he expects to have taken care of the coming weeks.  - CBC - Comprehensive metabolic panel - TSH - T4, free  2. Vitamin D deficiency  - VITAMIN D 25 Hydroxy (Vit-D Deficiency, Fractures)  3. Prediabetes  - Hemoglobin A1c  4. Primary hypertension Well controlled.  Continue current medications.    He states he already had flu shot this year at [facility]     The entirety of the information documented in the History of Present Illness, Review of Systems and Physical Exam were personally obtained by me. Portions of this information were initially documented by the CMA and reviewed by me for thoroughness and accuracy.     Lelon Huh, MD  [place] [phone] (phone) [phone] (fax)  [place]

## 2022-02-03 LAB — CBC
Hematocrit: 43.5 % (ref 37.5–51.0)
Hemoglobin: 14.2 g/dL (ref 13.0–17.7)
MCH: 29.8 pg (ref 26.6–33.0)
MCHC: 32.6 g/dL (ref 31.5–35.7)
MCV: 91 fL (ref 79–97)
Platelets: 282 10*3/uL (ref 150–450)
RBC: 4.77 x10E6/uL (ref 4.14–5.80)
RDW: 12.4 % (ref 11.6–15.4)
WBC: 7.6 10*3/uL (ref 3.4–10.8)

## 2022-02-03 LAB — COMPREHENSIVE METABOLIC PANEL
ALT: 6 IU/L (ref 0–44)
AST: 22 IU/L (ref 0–40)
Albumin/Globulin Ratio: 1.5 (ref 1.2–2.2)
Albumin: 4 g/dL (ref 3.8–4.8)
Alkaline Phosphatase: 81 IU/L (ref 44–121)
BUN/Creatinine Ratio: 17 (ref 10–24)
BUN: 16 mg/dL (ref 8–27)
Bilirubin Total: 0.5 mg/dL (ref 0.0–1.2)
CO2: 27 mmol/L (ref 20–29)
Calcium: 10.2 mg/dL (ref 8.6–10.2)
Chloride: 100 mmol/L (ref 96–106)
Creatinine, Ser: 0.93 mg/dL (ref 0.76–1.27)
Globulin, Total: 2.7 g/dL (ref 1.5–4.5)
Glucose: 108 mg/dL — ABNORMAL HIGH (ref 70–99)
Potassium: 5.5 mmol/L — ABNORMAL HIGH (ref 3.5–5.2)
Sodium: 141 mmol/L (ref 134–144)
Total Protein: 6.7 g/dL (ref 6.0–8.5)
eGFR: 84 mL/min/{1.73_m2} (ref >59)

## 2022-02-03 LAB — TSH: TSH: 1.58 u[IU]/mL (ref 0.450–4.500)

## 2022-02-03 LAB — HEMOGLOBIN A1C
Est. average glucose Bld gHb Est-mCnc: 128 mg/dL
Hgb A1c MFr Bld: 6.1 % — ABNORMAL HIGH (ref 4.8–5.6)

## 2022-02-03 LAB — T4, FREE: Free T4: 1.21 ng/dL (ref 0.82–1.77)

## 2022-02-03 LAB — VITAMIN D 25 HYDROXY (VIT D DEFICIENCY, FRACTURES): Vit D, 25-Hydroxy: 36.3 ng/mL (ref 30.0–100.0)

## 2022-02-09 DIAGNOSIS — G20C Parkinsonism, unspecified: Secondary | ICD-10-CM | POA: Diagnosis not present

## 2022-02-09 DIAGNOSIS — H42 Glaucoma in diseases classified elsewhere: Secondary | ICD-10-CM | POA: Diagnosis not present

## 2022-02-09 DIAGNOSIS — Z87891 Personal history of nicotine dependence: Secondary | ICD-10-CM | POA: Diagnosis not present

## 2022-02-09 DIAGNOSIS — E1139 Type 2 diabetes mellitus with other diabetic ophthalmic complication: Secondary | ICD-10-CM | POA: Diagnosis not present

## 2022-02-09 DIAGNOSIS — I10 Essential (primary) hypertension: Secondary | ICD-10-CM | POA: Diagnosis not present

## 2022-02-16 DIAGNOSIS — H6123 Impacted cerumen, bilateral: Secondary | ICD-10-CM | POA: Diagnosis not present

## 2022-02-16 DIAGNOSIS — H903 Sensorineural hearing loss, bilateral: Secondary | ICD-10-CM | POA: Diagnosis not present

## 2022-02-21 ENCOUNTER — Ambulatory Visit (INDEPENDENT_AMBULATORY_CARE_PROVIDER_SITE_OTHER): Payer: PPO

## 2022-02-21 VITALS — BP 136/76 | Ht 69.0 in | Wt 146.0 lb

## 2022-02-21 DIAGNOSIS — Z0000 Encounter for general adult medical examination without abnormal findings: Secondary | ICD-10-CM

## 2022-02-21 NOTE — Patient Instructions (Signed)
Alexander Espinoza , Thank you for taking time to come for your Medicare Wellness Visit. I appreciate your ongoing commitment to your health goals. Please review the following plan we discussed and let me know if I can assist you in the future.   These are the goals we discussed:  Goals      DIET - INCREASE WATER INTAKE     Recommend increasing water intake to 6 glasses or more a day.      Increase water intake     Starting 01/19/16, I will increase my water intake to 6 glasses a day.        This is a list of the screening recommended for you and due dates:  Health Maintenance  Topic Date Due   Hepatitis C Screening: USPSTF Recommendation to screen - Ages 36-79 yo.  Never done   DTaP/Tdap/Td vaccine (1 - Tdap) Never done   Zoster (Shingles) Vaccine (1 of 2) Never done   COVID-19 Vaccine (4 - 2023-24 season) 09/08/2021   Flu Shot  04/08/2022*   Medicare Annual Wellness Visit  02/22/2023   Pneumonia Vaccine  Completed   HPV Vaccine  Aged Out   Colon Cancer Screening  Discontinued  *Topic was postponed. The date shown is not the original due date.    Advanced directives: yes  Conditions/risks identified: low falls risk  Next appointment: Follow up in one year for your annual wellness visit. 02/25/2023 @1130$  telephone  Preventive Care 65 Years and Older, Male  Preventive care refers to lifestyle choices and visits with your health care provider that can promote health and wellness. What does preventive care include? A yearly physical exam. This is also called an annual well check. Dental exams once or twice a year. Routine eye exams. Ask your health care provider how often you should have your eyes checked. Personal lifestyle choices, including: Daily care of your teeth and gums. Regular physical activity. Eating a healthy diet. Avoiding tobacco and drug use. Limiting alcohol use. Practicing safe sex. Taking low doses of aspirin every day. Taking vitamin and mineral supplements  as recommended by your health care provider. What happens during an annual well check? The services and screenings done by your health care provider during your annual well check will depend on your age, overall health, lifestyle risk factors, and family history of disease. Counseling  Your health care provider may ask you questions about your: Alcohol use. Tobacco use. Drug use. Emotional well-being. Home and relationship well-being. Sexual activity. Eating habits. History of falls. Memory and ability to understand (cognition). Work and work Statistician. Screening  You may have the following tests or measurements: Height, weight, and BMI. Blood pressure. Lipid and cholesterol levels. These may be checked every 5 years, or more frequently if you are over 89 years old. Skin check. Lung cancer screening. You may have this screening every year starting at age 13 if you have a 30-pack-year history of smoking and currently smoke or have quit within the past 15 years. Fecal occult blood test (FOBT) of the stool. You may have this test every year starting at age 35. Flexible sigmoidoscopy or colonoscopy. You may have a sigmoidoscopy every 5 years or a colonoscopy every 10 years starting at age 82. Prostate cancer screening. Recommendations will vary depending on your family history and other risks. Hepatitis C blood test. Hepatitis B blood test. Sexually transmitted disease (STD) testing. Diabetes screening. This is done by checking your blood sugar (glucose) after you have not eaten  for a while (fasting). You may have this done every 1-3 years. Abdominal aortic aneurysm (AAA) screening. You may need this if you are a current or former smoker. Osteoporosis. You may be screened starting at age 22 if you are at high risk. Talk with your health care provider about your test results, treatment options, and if necessary, the need for more tests. Vaccines  Your health care provider may recommend  certain vaccines, such as: Influenza vaccine. This is recommended every year. Tetanus, diphtheria, and acellular pertussis (Tdap, Td) vaccine. You may need a Td booster every 10 years. Zoster vaccine. You may need this after age 110. Pneumococcal 13-valent conjugate (PCV13) vaccine. One dose is recommended after age 18. Pneumococcal polysaccharide (PPSV23) vaccine. One dose is recommended after age 29. Talk to your health care provider about which screenings and vaccines you need and how often you need them. This information is not intended to replace advice given to you by your health care provider. Make sure you discuss any questions you have with your health care provider. Document Released: 01/21/2015 Document Revised: 09/14/2015 Document Reviewed: 10/26/2014 Elsevier Interactive Patient Education  2017 [employer] Prevention in the Home Falls can cause injuries. They can happen to people of all ages. There are many things you can do to make your home safe and to help prevent falls. What can I do on the outside of my home? Regularly fix the edges of walkways and driveways and fix any cracks. Remove anything that might make you trip as you walk through a door, such as a raised step or threshold. Trim any bushes or trees on the path to your home. Use bright outdoor lighting. Clear any walking paths of anything that might make someone trip, such as rocks or tools. Regularly check to see if handrails are loose or broken. Make sure that both sides of any steps have handrails. Any raised decks and porches should have guardrails on the edges. Have any leaves, snow, or ice cleared regularly. Use sand or salt on walking paths during winter. Clean up any spills in your garage right away. This includes oil or grease spills. What can I do in the bathroom? Use night lights. Install grab bars by the toilet and in the tub and shower. Do not use towel bars as grab bars. Use non-skid mats or  decals in the tub or shower. If you need to sit down in the shower, use a plastic, non-slip stool. Keep the floor dry. Clean up any water that spills on the floor as soon as it happens. Remove soap buildup in the tub or shower regularly. Attach bath mats securely with double-sided non-slip rug tape. Do not have throw rugs and other things on the floor that can make you trip. What can I do in the bedroom? Use night lights. Make sure that you have a light by your bed that is easy to reach. Do not use any sheets or blankets that are too big for your bed. They should not hang down onto the floor. Have a firm chair that has side arms. You can use this for support while you get dressed. Do not have throw rugs and other things on the floor that can make you trip. What can I do in the kitchen? Clean up any spills right away. Avoid walking on wet floors. Keep items that you use a lot in easy-to-reach places. If you need to reach something above you, use a strong step stool that has  a grab bar. Keep electrical cords out of the way. Do not use floor polish or wax that makes floors slippery. If you must use wax, use non-skid floor wax. Do not have throw rugs and other things on the floor that can make you trip. What can I do with my stairs? Do not leave any items on the stairs. Make sure that there are handrails on both sides of the stairs and use them. Fix handrails that are broken or loose. Make sure that handrails are as long as the stairways. Check any carpeting to make sure that it is firmly attached to the stairs. Fix any carpet that is loose or worn. Avoid having throw rugs at the top or bottom of the stairs. If you do have throw rugs, attach them to the floor with carpet tape. Make sure that you have a light switch at the top of the stairs and the bottom of the stairs. If you do not have them, ask someone to add them for you. What else can I do to help prevent falls? Wear shoes that: Do not  have high heels. Have rubber bottoms. Are comfortable and fit you well. Are closed at the toe. Do not wear sandals. If you use a stepladder: Make sure that it is fully opened. Do not climb a closed stepladder. Make sure that both sides of the stepladder are locked into place. Ask someone to hold it for you, if possible. Clearly mark and make sure that you can see: Any grab bars or handrails. First and last steps. Where the edge of each step is. Use tools that help you move around (mobility aids) if they are needed. These include: Canes. Walkers. Scooters. Crutches. Turn on the lights when you go into a dark area. Replace any light bulbs as soon as they burn out. Set up your furniture so you have a clear path. Avoid moving your furniture around. If any of your floors are uneven, fix them. If there are any pets around you, be aware of where they are. Review your medicines with your doctor. Some medicines can make you feel dizzy. This can increase your chance of falling. Ask your doctor what other things that you can do to help prevent falls. This information is not intended to replace advice given to you by your health care provider. Make sure you discuss any questions you have with your health care provider. Document Released: 10/21/2008 Document Revised: 06/02/2015 Document Reviewed: 01/29/2014 Elsevier Interactive Patient Education  2017 [employer].

## 2022-02-21 NOTE — Progress Notes (Signed)
Subjective:   Alexander Espinoza is a 82 y.o. male who presents with his wife for Medicare Annual/Subsequent preventive examination.  Review of Systems    Cardiac Risk Factors include: advanced age (>64mn, >>79women);hypertension     Objective:    Today's Vitals   02/21/22 1150  BP: 136/76  Weight: 146 lb (66.2 kg)  Height: 5' 9"$  (1.753 m)   Body mass index is 21.56 kg/m.     02/21/2022   11:59 AM 02/20/2018    2:14 PM 01/22/2017   10:09 AM 01/19/2016    9:01 AM  Advanced Directives  Does Patient Have a Medical Advance Directive? Yes Yes Yes Yes  Type of ACorporate treasurerof [place]Living will [place]Living will H[place] CityLiving will  Copy of [place]in Chart?  No - copy requested No - copy requested No - copy requested    Current Medications (verified) Outpatient Encounter Medications as of 02/21/2022  Medication Sig   acetaminophen (TYLENOL) 500 MG tablet Take 2 tablets (1,000 mg total) by mouth every 6 (six) hours as needed for moderate pain.   amLODipine (NORVASC) 5 MG tablet TAKE 1 TABLET(5 MG) BY MOUTH DAILY   carbidopa-levodopa (SINEMET IR) 25-100 MG tablet Take by mouth 3 (three) times daily.   latanoprost (XALATAN) 0.005 % ophthalmic solution INSTILL 1 DROP IN OU QHS   losartan-hydrochlorothiazide (HYZAAR) 100-25 MG tablet TAKE 1 TABLET BY MOUTH DAILY   metoprolol succinate (TOPROL-XL) 100 MG 24 hr tablet TAKE 1 TABLET BY MOUTH DAILY   tadalafil (CIALIS) 5 MG tablet Take 1 tablet (5 mg total) by mouth daily.   terbinafine (LAMISIL) 250 MG tablet Take 1 tablet (250 mg total) by mouth daily.   No facility-administered encounter medications on file as of 02/21/2022.    Allergies (verified) Patient has no known allergies.   History: Past Medical History:  Diagnosis Date   ED (erectile dysfunction)    History of mumps    Hypertension    OSA (obstructive sleep apnea)    Prediabetes     Restless leg    Past Surgical History:  Procedure Laterality Date   Sleep study  04/24/2013   AHI=15.9, RDI=17.1 on MSPG   Family History  Problem Relation Age of Onset   Prostate cancer Father    Stroke Sister    Parkinsonism Maternal Uncle    Social History   Socioeconomic History   Marital status: Married    Spouse name: Not on file   Number of children: 2   Years of education: Not on file   Highest education level: Some college, no degree  Occupational History   Occupation: retired  Tobacco Use   Smoking status: Former    Packs/day: 1.00    Years: 13.00    Total pack years: 13.00    Types: Cigarettes    Quit date: 01/09/1968    Years since quitting: 54.1   Smokeless tobacco: Never  Vaping Use   Vaping Use: Never used  Substance and Sexual Activity   Alcohol use: Yes    Alcohol/week: 0.0 standard drinks of alcohol    Comment: one beer every once in a while   Drug use: No   Sexual activity: Not on file  Other Topics Concern   Not on file  Social History Narrative   Not on file   Social Determinants of Health   Financial Resource Strain: Low Risk  (02/21/2022)   Overall Financial  Resource Strain (CARDIA)    Difficulty of Paying Living Expenses: Not hard at all  Food Insecurity: No Food Insecurity (02/21/2022)   Hunger Vital Sign    Worried About Running Out of Food in the Last Year: Never true    Ran Out of Food in the Last Year: Never true  Transportation Needs: No Transportation Needs (02/21/2022)   PRAPARE - Hydrologist (Medical): No    Lack of Transportation (Non-Medical): No  Physical Activity: Inactive (02/21/2022)   Exercise Vital Sign    Days of Exercise per Week: 0 days    Minutes of Exercise per Session: 0 min  Stress: No Stress Concern Present (02/21/2022)   [place]    Feeling of Stress : Not at all  Social Connections: Unknown (02/21/2022)    Social Connection and Isolation Panel [NHANES]    Frequency of Communication with Friends and Family: Patient refused    Frequency of Social Gatherings with Friends and Family: Patient refused    Attends Religious Services: Patient refused    Marine scientist or Organizations: Patient refused    Attends Archivist Meetings: Patient refused    Marital Status: Patient refused    Tobacco Counseling Counseling given: Not Answered   Clinical Intake:  Pre-visit preparation completed: Yes  Pain : No/denies pain     BMI - recorded: 21.56 Nutritional Status: BMI of 19-24  Normal Nutritional Risks: None Diabetes: No     Diabetic?no  Interpreter Needed?: No  Information entered by :: B.Jennene Downie,LPN   Activities of Daily Living    02/21/2022   12:24 PM 02/02/2022    1:49 PM  In your present state of health, do you have any difficulty performing the following activities:  Hearing? 0 0  Vision? 0 0  Difficulty concentrating or making decisions? 1 1  Walking or climbing stairs? 0 0  Dressing or bathing? 0 0  Doing errands, shopping? 0 0  Preparing Food and eating ? N   Using the Toilet? N   In the past six months, have you accidently leaked urine? N   Do you have problems with loss of bowel control? N   Managing your Medications? N   Managing your Finances? N   Housekeeping or managing your Housekeeping? N     Patient Care Team: Birdie Sons, MD as PCP - General (Family Medicine) Odette Fraction as Consulting Physician (Optometry) Ree Edman, MD as Consulting Physician (Dermatology) Vladimir Crofts, MD as Consulting Physician (Neurology) Fransisca Connors as Consulting Physician (Dermatology)  Indicate any recent Medical Services you may have received from other than [facility] providers in the past year (date may be approximate).     Assessment:   This is a routine wellness examination for Alexander Espinoza.  Hearing/Vision screen Hearing Screening  - Comments:: Adequate hearing Vision Screening - Comments:: Adequate vision:Dr Joya San  Dietary issues and exercise activities discussed: Current Exercise Habits: The patient does not participate in regular exercise at present (pt says he stays busy), Exercise limited by: None identified   Goals Addressed             This Visit's Progress    Increase water intake   On track    Starting 01/19/16, I will increase my water intake to 6 glasses a day.       Depression Screen    02/21/2022   11:54 AM 02/02/2022    1:49  PM 02/06/2021    2:00 PM 09/23/2020    2:08 PM 03/22/2020    1:10 PM 11/20/2019    2:11 PM 02/20/2018    2:24 PM  PHQ 2/9 Scores  PHQ - 2 Score 0 0 0 0 0 0 0  PHQ- 9 Score 2 2 0 0 0 0 0    Fall Risk    02/21/2022   11:53 AM 02/02/2022    1:49 PM 02/06/2021    2:00 PM 09/23/2020    2:08 PM 11/20/2019    2:11 PM  Fall Risk   Falls in the past year? 0 0 0 0 0  Number falls in past yr: 0 0 0 0 0  Injury with Fall? 1 0 0 0 0  Risk for fall due to : No Fall Risks  No Fall Risks No Fall Risks   Follow up Education provided;Falls prevention discussed  Falls evaluation completed Falls evaluation completed Falls evaluation completed    FALL RISK PREVENTION PERTAINING TO THE HOME:  Any stairs in or around the home? No  If so, are there any without handrails? No  Home free of loose throw rugs in walkways, pet beds, electrical cords, etc? Yes  Adequate lighting in your home to reduce risk of falls? Yes   ASSISTIVE DEVICES UTILIZED TO PREVENT FALLS:  Life alert? No  Use of a cane, walker or w/c? No  Grab bars in the bathroom? No  Shower chair or bench in shower? No  Elevated toilet seat or a handicapped toilet? No   TIMED UP AND GO:  Was the test performed? Yes .  Length of time to ambulate 10 feet: 12 sec.   Gait steady and fast without use of assistive device  Cognitive Function:        02/21/2022   12:00 PM 01/19/2016    9:09 AM  6CIT Screen  What Year?  0 points 0 points  What month? 0 points 0 points  What time? 0 points 0 points  Count back from 20 0 points 0 points  Months in reverse 2 points 0 points  Repeat phrase 2 points 6 points  Total Score 4 points 6 points    Immunizations Immunization History  Administered Date(s) Administered   Fluad Quad(high Dose 65+) 09/23/2020   Influenza, High Dose Seasonal PF 09/10/2017, 10/28/2019   Influenza-Unspecified 11/03/2014, 12/22/2016   PFIZER(Purple Top)SARS-COV-2 Vaccination 02/01/2019, 02/23/2019, 11/20/2019   Pneumococcal Conjugate-13 04/23/2013   Pneumococcal Polysaccharide-23 01/11/2011, 01/11/2012    TDAP status: Up to date  Flu Vaccine status: Up to date  Pneumococcal vaccine status: Up to date  Covid-19 vaccine status: Completed vaccines  Qualifies for Shingles Vaccine? Yes   Zostavax completed No   Shingrix Completed?: No.    Education has been provided regarding the importance of this vaccine. Patient has been advised to call insurance company to determine out of pocket expense if they have not yet received this vaccine. Advised may also receive vaccine at local pharmacy or Health Dept. Verbalized acceptance and understanding.  Screening Tests Health Maintenance  Topic Date Due   Hepatitis C Screening  Never done   DTaP/Tdap/Td (1 - Tdap) Never done   Zoster Vaccines- Shingrix (1 of 2) Never done   COVID-19 Vaccine (4 - 2023-24 season) 09/08/2021   INFLUENZA VACCINE  04/08/2022 (Originally 08/08/2021)   Medicare Annual Wellness (AWV)  02/22/2023   Pneumonia Vaccine 34+ Years old  Completed   HPV VACCINES  Aged Out   COLONOSCOPY (Pts  45-6yr Insurance coverage will need to be confirmed)  Discontinued    Health Maintenance  Health Maintenance Due  Topic Date Due   Hepatitis C Screening  Never done   DTaP/Tdap/Td (1 - Tdap) Never done   Zoster Vaccines- Shingrix (1 of 2) Never done   COVID-19 Vaccine (4 - 2023-24 season) 09/08/2021    Colorectal cancer  screening: No longer required.   Lung Cancer Screening: (Low Dose CT Chest recommended if Age 368-80years, 30 pack-year currently smoking OR have quit w/in 15years.) does not qualify.   Lung Cancer Screening Referral: no  Additional Screening:  Hepatitis C Screening: does not qualify; Completed yes  Vision Screening: Recommended annual ophthalmology exams for early detection of glaucoma and other disorders of the eye. Is the patient up to date with their annual eye exam?  Yes  Who is the provider or what is the name of the office in which the patient attends annual eye exams? Dr TJoya SanIf pt is not established with a provider, would they like to be referred to a provider to establish care? No .   Dental Screening: Recommended annual dental exams for proper oral hygiene  Community Resource Referral / Chronic Care Management: CRR required this visit?  No   CCM required this visit?  No      Plan:     I have personally reviewed and noted the following in the patient's chart:   Medical and social history Use of alcohol, tobacco or illicit drugs  Current medications and supplements including opioid prescriptions. Patient is not currently taking opioid prescriptions. Functional ability and status Nutritional status Physical activity Advanced directives List of other physicians Hospitalizations, surgeries, and ER visits in previous 12 months Vitals Screenings to include cognitive, depression, and falls Referrals and appointments  In addition, I have reviewed and discussed with patient certain preventive protocols, quality metrics, and best practice recommendations. A written personalized care plan for preventive services as well as general preventive health recommendations were provided to patient.     [facility] LPN   [id number]  Nurse Notes: pt (and wife) in office stating he is doing well. Pt shares that a nurse has been making home visits for the last 3 years but  they were called to say an office visit was required or insurance will be cancelled. Pt reassured and visit completed.

## 2022-03-08 DIAGNOSIS — G4733 Obstructive sleep apnea (adult) (pediatric): Secondary | ICD-10-CM | POA: Diagnosis not present

## 2022-03-08 DIAGNOSIS — R634 Abnormal weight loss: Secondary | ICD-10-CM | POA: Diagnosis not present

## 2022-03-08 DIAGNOSIS — R29818 Other symptoms and signs involving the nervous system: Secondary | ICD-10-CM | POA: Diagnosis not present

## 2022-03-08 DIAGNOSIS — G5793 Unspecified mononeuropathy of bilateral lower limbs: Secondary | ICD-10-CM | POA: Diagnosis not present

## 2022-03-21 DIAGNOSIS — H401122 Primary open-angle glaucoma, left eye, moderate stage: Secondary | ICD-10-CM | POA: Diagnosis not present

## 2022-03-21 DIAGNOSIS — H401112 Primary open-angle glaucoma, right eye, moderate stage: Secondary | ICD-10-CM | POA: Diagnosis not present

## 2022-03-21 DIAGNOSIS — H2513 Age-related nuclear cataract, bilateral: Secondary | ICD-10-CM | POA: Diagnosis not present

## 2022-03-30 DIAGNOSIS — H401122 Primary open-angle glaucoma, left eye, moderate stage: Secondary | ICD-10-CM | POA: Diagnosis not present

## 2022-03-30 DIAGNOSIS — H401112 Primary open-angle glaucoma, right eye, moderate stage: Secondary | ICD-10-CM | POA: Diagnosis not present

## 2022-04-11 ENCOUNTER — Other Ambulatory Visit: Payer: Self-pay | Admitting: Family Medicine

## 2022-04-11 DIAGNOSIS — I10 Essential (primary) hypertension: Secondary | ICD-10-CM

## 2022-04-11 NOTE — Telephone Encounter (Signed)
Requested Prescriptions  Pending Prescriptions Disp Refills   amLODipine (NORVASC) 5 MG tablet [Pharmacy Med Name: AMLODIPINE BESYLATE 5MG  TABLETS] 90 tablet 0    Sig: TAKE 1 TABLET(5 MG) BY MOUTH DAILY     Cardiovascular: Calcium Channel Blockers 2 Passed - 04/11/2022  6:19 AM      Passed - Last BP in normal range    BP Readings from Last 1 Encounters:  02/21/22 136/76         Passed - Last Heart Rate in normal range    Pulse Readings from Last 1 Encounters:  02/02/22 64         Passed - Valid encounter within last 6 months    Recent Outpatient Visits           2 months ago Abnormal weight loss   [facility] Birdie Sons, MD   1 year ago Annual physical exam   [facility] Birdie Sons, MD   1 year ago Prediabetes   [place], Donald E, MD   2 years ago Prediabetes   [place], Donald E, MD   2 years ago Medicare annual wellness visit, subsequent   [facility] Birdie Sons, MD       Future Appointments             In 3 months Fisher, Kirstie Peri, MD [facility]

## 2022-04-13 DIAGNOSIS — H401122 Primary open-angle glaucoma, left eye, moderate stage: Secondary | ICD-10-CM | POA: Diagnosis not present

## 2022-04-13 DIAGNOSIS — H401112 Primary open-angle glaucoma, right eye, moderate stage: Secondary | ICD-10-CM | POA: Diagnosis not present

## 2022-04-21 ENCOUNTER — Other Ambulatory Visit: Payer: Self-pay | Admitting: Family Medicine

## 2022-04-21 DIAGNOSIS — I10 Essential (primary) hypertension: Secondary | ICD-10-CM

## 2022-04-27 DIAGNOSIS — H401112 Primary open-angle glaucoma, right eye, moderate stage: Secondary | ICD-10-CM | POA: Diagnosis not present

## 2022-04-27 DIAGNOSIS — H401122 Primary open-angle glaucoma, left eye, moderate stage: Secondary | ICD-10-CM | POA: Diagnosis not present

## 2022-04-27 DIAGNOSIS — H2513 Age-related nuclear cataract, bilateral: Secondary | ICD-10-CM | POA: Diagnosis not present

## 2022-05-07 ENCOUNTER — Other Ambulatory Visit: Payer: Self-pay | Admitting: Family Medicine

## 2022-05-07 DIAGNOSIS — I10 Essential (primary) hypertension: Secondary | ICD-10-CM

## 2022-05-07 MED ORDER — AMLODIPINE BESYLATE 5 MG PO TABS: ORAL_TABLET | ORAL | 0 refills | Status: DC

## 2022-05-07 NOTE — Telephone Encounter (Signed)
Pt's wife called saying they did not pick up the Amlodipine 5 mg from [facility].  They have switched pharmacies and would like this Rx sent to [facility] in [place]  CB@  [phone]

## 2022-05-07 NOTE — Telephone Encounter (Signed)
Requested Prescriptions  Pending Prescriptions Disp Refills   amLODipine (NORVASC) 5 MG tablet 90 tablet 0    Sig: TAKE 1 TABLET(5 MG) BY MOUTH DAILY     Cardiovascular: Calcium Channel Blockers 2 Passed - 05/07/2022 11:22 AM      Passed - Last BP in normal range    BP Readings from Last 1 Encounters:  02/21/22 136/76         Passed - Last Heart Rate in normal range    Pulse Readings from Last 1 Encounters:  02/02/22 64         Passed - Valid encounter within last 6 months    Recent Outpatient Visits           3 months ago Abnormal weight loss   [facility] Malva Limes, MD   1 year ago Annual physical exam   [facility] Malva Limes, MD   1 year ago Prediabetes   [facility] Malva Limes, MD   2 years ago Prediabetes   [facility] Malva Limes, MD   2 years ago Medicare annual wellness visit, subsequent   [facility] Malva Limes, MD       Future Appointments             In 2 months Fisher, Demetrios Isaacs, MD [facility]

## 2022-05-15 DIAGNOSIS — H2511 Age-related nuclear cataract, right eye: Secondary | ICD-10-CM | POA: Diagnosis not present

## 2022-05-15 DIAGNOSIS — H2512 Age-related nuclear cataract, left eye: Secondary | ICD-10-CM | POA: Diagnosis not present

## 2022-05-15 DIAGNOSIS — H401122 Primary open-angle glaucoma, left eye, moderate stage: Secondary | ICD-10-CM | POA: Diagnosis not present

## 2022-06-11 ENCOUNTER — Encounter: Payer: Self-pay | Admitting: Ophthalmology

## 2022-06-11 NOTE — Anesthesia Preprocedure Evaluation (Addendum)
Anesthesia Evaluation  Patient identified by MRN, date of birth, ID band Patient awake    Reviewed: Allergy & Precautions, H&P , NPO status , Patient's Chart, lab work & pertinent test results  Airway Mallampati: III       Dental  (+) Upper Dentures   Pulmonary neg pulmonary ROS, sleep apnea           Cardiovascular hypertension, negative cardio ROS      Neuro/Psych  Neuromuscular disease negative neurological ROS  negative psych ROS   GI/Hepatic negative GI ROS, Neg liver ROS,,,  Endo/Other  negative endocrine ROS    Renal/GU negative Renal ROS  negative genitourinary   Musculoskeletal negative musculoskeletal ROS (+)    Abdominal   Peds negative pediatric ROS (+)  Hematology negative hematology ROS (+)   Anesthesia Other Findings Hypertension  History of mumps Restless leg  OSA (obstructive sleep apnea) ED (erectile dysfunction)  Prediabetes    Reproductive/Obstetrics negative OB ROS                             Anesthesia Physical Anesthesia Plan  ASA: 2  Anesthesia Plan: MAC   Post-op Pain Management:    Induction: Intravenous  PONV Risk Score and Plan:   Airway Management Planned: Natural Airway and Nasal Cannula  Additional Equipment:   Intra-op Plan:   Post-operative Plan:   Informed Consent: I have reviewed the patients History and Physical, chart, labs and discussed the procedure including the risks, benefits and alternatives for the proposed anesthesia with the patient or authorized representative who has indicated his/her understanding and acceptance.     Dental Advisory Given  Plan Discussed with: Anesthesiologist, CRNA and Surgeon  Anesthesia Plan Comments: (Patient consented for risks of anesthesia including but not limited to:  - adverse reactions to medications - damage to eyes, teeth, lips or other oral mucosa - nerve damage due to positioning   - sore throat or hoarseness - Damage to heart, brain, nerves, lungs, other parts of body or loss of life  Patient voiced understanding.)       Anesthesia Quick Evaluation

## 2022-06-14 NOTE — Discharge Instructions (Signed)

## 2022-06-18 ENCOUNTER — Encounter: Payer: Self-pay | Admitting: Ophthalmology

## 2022-06-18 ENCOUNTER — Other Ambulatory Visit: Payer: Self-pay

## 2022-06-18 ENCOUNTER — Ambulatory Visit
Admission: RE | Admit: 2022-06-18 | Discharge: 2022-06-18 | Disposition: A | Discharge status: Home / Self Care | Payer: PPO | Source: Ambulatory Visit | Attending: Ophthalmology | Admitting: Ophthalmology

## 2022-06-18 ENCOUNTER — Ambulatory Visit: Payer: PPO | Admitting: Anesthesiology

## 2022-06-18 ENCOUNTER — Encounter
Admission: RE | Disposition: A | Discharge status: Home / Self Care | Payer: Self-pay | Source: Ambulatory Visit | Attending: Ophthalmology

## 2022-06-18 DIAGNOSIS — Z87891 Personal history of nicotine dependence: Secondary | ICD-10-CM | POA: Insufficient documentation

## 2022-06-18 DIAGNOSIS — H401112 Primary open-angle glaucoma, right eye, moderate stage: Secondary | ICD-10-CM | POA: Diagnosis not present

## 2022-06-18 DIAGNOSIS — I10 Essential (primary) hypertension: Secondary | ICD-10-CM | POA: Diagnosis not present

## 2022-06-18 DIAGNOSIS — H2511 Age-related nuclear cataract, right eye: Secondary | ICD-10-CM | POA: Insufficient documentation

## 2022-06-18 HISTORY — PX: CATARACT EXTRACTION W/PHACO: SHX586

## 2022-06-18 SURGERY — PHACOEMULSIFICATION, CATARACT, WITH IOL INSERTION
Anesthesia: Monitor Anesthesia Care | Site: Eye | Laterality: Right

## 2022-06-18 MED ORDER — SIGHTPATH DOSE#1 BSS IO SOLN
INTRAOCULAR | Status: DC | PRN
  Administered 2022-06-18: 15 mL

## 2022-06-18 MED ORDER — SIGHTPATH DOSE#1 NA HYALUR & NA CHOND-NA HYALUR IO KIT
PACK | INTRAOCULAR | Status: DC | PRN
  Administered 2022-06-18: 1 via OPHTHALMIC

## 2022-06-18 MED ORDER — MOXIFLOXACIN HCL 0.5 % OP SOLN
OPHTHALMIC | Status: DC | PRN
  Administered 2022-06-18: .2 mL via OPHTHALMIC

## 2022-06-18 MED ORDER — ARMC OPHTHALMIC DILATING DROPS
1.0000 | OPHTHALMIC | Status: DC | PRN
  Administered 2022-06-18 (×3): 1 via OPHTHALMIC

## 2022-06-18 MED ORDER — LACTATED RINGERS IV SOLN: INTRAVENOUS | Status: DC

## 2022-06-18 MED ORDER — NA HYALUR & NA CHOND-NA HYALUR 0.55-0.5 ML IO KIT
PACK | INTRAOCULAR | Status: DC | PRN
  Administered 2022-06-18: 1 via OPHTHALMIC

## 2022-06-18 MED ORDER — TETRACAINE HCL 0.5 % OP SOLN
1.0000 [drp] | OPHTHALMIC | Status: DC | PRN
  Administered 2022-06-18 (×3): 1 [drp] via OPHTHALMIC

## 2022-06-18 MED ORDER — LIDOCAINE HCL (PF) 2 % IJ SOLN
INTRAOCULAR | Status: DC | PRN
  Administered 2022-06-18: 1 mL via INTRAOCULAR

## 2022-06-18 MED ORDER — MIDAZOLAM HCL 2 MG/2ML IJ SOLN
INTRAMUSCULAR | Status: DC | PRN
  Administered 2022-06-18: 1 mg via INTRAVENOUS

## 2022-06-18 MED ORDER — PHENYLEPHRINE-KETOROLAC 1-0.3 % IO SOLN
INTRAOCULAR | Status: DC | PRN
  Administered 2022-06-18: 79 mL via OPHTHALMIC

## 2022-06-18 MED ORDER — FENTANYL CITRATE (PF) 100 MCG/2ML IJ SOLN
INTRAMUSCULAR | Status: DC | PRN
  Administered 2022-06-18: 25 ug via INTRAVENOUS

## 2022-06-18 SURGICAL SUPPLY — 10 items
BLADE DUAL KAHOOK SINGLE USE (BLADE) IMPLANT
CATARACT SUITE SIGHTPATH (MISCELLANEOUS) ×1 IMPLANT
DISSECTOR HYDRO NUCLEUS 50X22 (MISCELLANEOUS) ×1 IMPLANT
DRSG TEGADERM 2-3/8X2-3/4 SM (GAUZE/BANDAGES/DRESSINGS) ×1 IMPLANT
FEE CATARACT SUITE SIGHTPATH (MISCELLANEOUS) ×1 IMPLANT
GLOVE SURG SYN 7.5 E (GLOVE) ×1 IMPLANT
GLOVE SURG SYN 7.5 PF PI (GLOVE) ×1 IMPLANT
GLOVE SURG SYN 8.5 E (GLOVE) ×1 IMPLANT
GLOVE SURG SYN 8.5 PF PI (GLOVE) ×1 IMPLANT
LENS IOL TECNIS EYHANCE 20.0 (Intraocular Lens) IMPLANT

## 2022-06-18 NOTE — Anesthesia Postprocedure Evaluation (Signed)
Anesthesia Post Note  Patient: Alexander Espinoza  Procedure(s) Performed: CATARACT EXTRACTION PHACO AND INTRAOCULAR LENS PLACEMENT (IOC) RIGHT KAHOOK DUAL BLADE GONIOTOMY  9.20  01:03.1 (Right: Eye)  Patient location during evaluation: PACU Anesthesia Type: MAC Level of consciousness: awake and alert Pain management: pain level controlled Vital Signs Assessment: post-procedure vital signs reviewed and stable Respiratory status: spontaneous breathing, nonlabored ventilation, respiratory function stable and patient connected to nasal cannula oxygen Cardiovascular status: stable and blood pressure returned to baseline Postop Assessment: no apparent nausea or vomiting Anesthetic complications: no   No notable events documented.   Last Vitals:  Vitals:   06/18/22 1243 06/18/22 1248  BP: 123/63 (!) 103/59  Pulse: (!) 57 (!) 56  Resp: 19 15  Temp: (!) 36.2 C (!) 36.2 C  SpO2: 99% 97%    Last Pain:  Vitals:   06/18/22 1248  TempSrc:   PainSc: 0-No pain                 Alexander Espinoza

## 2022-06-18 NOTE — Transfer of Care (Signed)
Immediate Anesthesia Transfer of Care Note  Patient: Alexander Espinoza  Procedure(s) Performed: CATARACT EXTRACTION PHACO AND INTRAOCULAR LENS PLACEMENT (IOC) RIGHT KAHOOK DUAL BLADE GONIOTOMY  9.20  01:03.1 (Right: Eye)  Patient Location: PACU  Anesthesia Type:MAC  Level of Consciousness: awake, alert , and oriented  Airway & Oxygen Therapy: Patient Spontanous Breathing  Post-op Assessment: Report given to RN and Post -op Vital signs reviewed and stable  Post vital signs: Reviewed and stable  Last Vitals: See flow sheet for PACU  Temp normal Vitals Value Taken Time  BP

## 2022-06-18 NOTE — H&P (Signed)
[facility]   Primary Care Physician:  Malva Limes, MD Ophthalmologist: Dr. Deberah Pelton  Pre-Procedure History & Physical: HPI:  Alexander Espinoza is a 82 y.o. male here for cataract surgery.   Past Medical History:  Diagnosis Date   ED (erectile dysfunction)    History of mumps    Hypertension    OSA (obstructive sleep apnea)    Prediabetes    Restless leg     Past Surgical History:  Procedure Laterality Date   Sleep study  04/24/2013   AHI=15.9, RDI=17.1 on MSPG    Prior to Admission medications   Medication Sig Start Date End Date Taking? Authorizing Provider  acetaminophen (TYLENOL) 500 MG tablet Take 2 tablets (1,000 mg total) by mouth every 6 (six) hours as needed for moderate pain. 09/19/14  Yes Betancourt, Jarold Song, NP  amLODipine (NORVASC) 5 MG tablet TAKE 1 TABLET(5 MG) BY MOUTH DAILY 05/07/22  Yes Malva Limes, MD  carbidopa-levodopa (SINEMET IR) 25-100 MG tablet Take by mouth 3 (three) times daily. 09/21/20  Yes Lonell Face, MD  latanoprost (XALATAN) 0.005 % ophthalmic solution INSTILL 1 DROP IN OU QHS 02/14/18  Yes [provider]  losartan-hydrochlorothiazide (HYZAAR) 100-25 MG tablet TAKE 1 TABLET BY MOUTH DAILY 06/09/21  Yes Malva Limes, MD  metoprolol succinate (TOPROL-XL) 100 MG 24 hr tablet TAKE (1) TABLET BY MOUTH EVERY DAY 04/23/22  Yes Malva Limes, MD  terbinafine (LAMISIL) 250 MG tablet Take 1 tablet (250 mg total) by mouth daily. 03/15/21  Yes Malva Limes, MD  vitamin B-12 (CYANOCOBALAMIN) 500 MCG tablet Take 500 mcg by mouth daily.   Yes [provider]  tadalafil (CIALIS) 5 MG tablet Take 1 tablet (5 mg total) by mouth daily. Patient not taking: Reported on 06/11/2022 01/19/16   Malva Limes, MD    Allergies as of 05/02/2022   (No Known Allergies)    Family History  Problem Relation Age of Onset   Prostate cancer Father    Stroke Sister    Parkinsonism Maternal Uncle     Social History    Socioeconomic History   Marital status: Married    Spouse name: Not on file   Number of children: 2   Years of education: Not on file   Highest education level: Some college, no degree  Occupational History   Occupation: retired  Tobacco Use   Smoking status: Former    Packs/day: 1.00    Years: 13.00    Additional pack years: 0.00    Total pack years: 13.00    Types: Cigarettes    Quit date: 01/09/1968    Years since quitting: 54.4   Smokeless tobacco: Never  Vaping Use   Vaping Use: Never used  Substance and Sexual Activity   Alcohol use: Yes    Alcohol/week: 0.0 standard drinks of alcohol    Comment: one beer every once in a while   Drug use: No   Sexual activity: Not on file  Other Topics Concern   Not on file  Social History Narrative   Not on file   Social Determinants of Health   Financial Resource Strain: Low Risk  (02/21/2022)   Overall Financial Resource Strain (CARDIA)    Difficulty of Paying Living Expenses: Not hard at all  Food Insecurity: No Food Insecurity (02/21/2022)   Hunger Vital Sign    Worried About Running Out of Food in the Last Year: Never true    Ran Out  of Food in the Last Year: Never true  Transportation Needs: No Transportation Needs (02/21/2022)   PRAPARE - Administrator, Civil Service (Medical): No    Lack of Transportation (Non-Medical): No  Physical Activity: Inactive (02/21/2022)   Exercise Vital Sign    Days of Exercise per Week: 0 days    Minutes of Exercise per Session: 0 min  Stress: No Stress Concern Present (02/21/2022)   [employer] of Occupational Health - Occupational Stress Questionnaire    Feeling of Stress : Not at all  Social Connections: Patient Declined (02/21/2022)   Social Connection and Isolation Panel [NHANES]    Frequency of Communication with Friends and Family: Patient declined    Frequency of Social Gatherings with Friends and Family: Patient declined    Attends Religious Services: Patient  declined    Database administrator or Organizations: Patient declined    Attends Banker Meetings: Patient declined    Marital Status: Patient declined  Intimate Partner Violence: Not At Risk (02/21/2022)   Humiliation, Afraid, Rape, and Kick questionnaire    Fear of Current or Ex-Partner: No    Emotionally Abused: No    Physically Abused: No    Sexually Abused: No    Review of Systems: See HPI, otherwise negative ROS  Physical Exam: Ht 5\' 9"  (1.753 m)   Wt 67.1 kg   BMI 21.86 kg/m  General:   Alert, cooperative in NAD Head:  Normocephalic and atraumatic. Respiratory:  Normal work of breathing. Cardiovascular:  RRR  Impression/Plan: Alexander Espinoza is here for cataract surgery.  Risks, benefits, limitations, and alternatives regarding cataract surgery have been reviewed with the patient.  Questions have been answered.  All parties agreeable.   Estanislado Pandy, MD  06/18/2022, 7:16 AM

## 2022-06-18 NOTE — Op Note (Signed)
OPERATIVE NOTE  Alexander Espinoza [id number] 06/18/2022   PREOPERATIVE DIAGNOSIS: Nuclear sclerotic cataract right eye. 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 Item Serial No. Manufacturer Lot No. LRB No. Used Action  LENS IOL TECNIS EYHANCE 20.0 - [id number] Intraocular Lens LENS IOL TECNIS EYHANCE 20.0 [id number] SIGHTPATH  Right 1 Implanted      SURGEON:  Julious Payer. Rolley Sims, MD   ANESTHESIA:  Topical with tetracaine drops, augmented with 1% preservative-free intracameral lidocaine.   COMPLICATIONS:  None.   DESCRIPTION OF PROCEDURE:  The patient was identified in the holding room and transported to the operating room and placed in the supine position under the operating microscope.  The right eye was identified as the operative eye, which was prepped and draped in the usual sterile ophthalmic fashion.   A 1 millimeter clear-corneal paracentesis was made superotemporally. Preservative-free 1% lidocaine mixed with 1:1,000 bisulfite-free aqueous solution of epinephrine was injected into the anterior chamber. The anterior chamber was then filled with Viscoat viscoelastic. A 2.4 millimeter keratome was used to make a clear-corneal incision inferotemporally. A curvilinear capsulorrhexis was made with a cystotome and capsulorrhexis forceps.   The microscope was adjusted and a gonioprism was used to visualize the trabecular meshwork.  The [facility] Dual Blade was advanced across the anterior chamber under viscoelastic.   The blade was used to mark the trabecular meshwork at the 1:30 position.  The blade was placed two clock hours clockwise into the meshwork.  Proper positioning was confirmed.  The blade was passed counterclockwise through the meshwork to excise approximately two to three clock-hours of trabecular meshwork. The microscope was adjusted back to its original position.  Balanced salt solution was used to hydrodissect and hydrodelineate the nucleus. Phacoemulsification was then used to remove the lens nucleus and epinucleus. The remaining cortex was then removed using the irrigation and aspiration handpiece. Provisc was then placed into the capsular bag to distend it for lens placement. A +20.00 D DIB00 intraocular lens was then injected into the capsular bag. The remaining viscoelastic was aspirated.   Wounds were hydrated with balanced salt solution.  The anterior chamber was inflated to a physiologic pressure with balanced salt solution.  No wound leaks were noted. Vigamox was injected intracamerally.  The patient was taken to the recovery room in stable condition without complications of anesthesia or surgery.  Rolly Pancake [place] 06/18/2022, 12:38 PM

## 2022-06-19 ENCOUNTER — Encounter: Payer: Self-pay | Admitting: Ophthalmology

## 2022-06-19 DIAGNOSIS — H2512 Age-related nuclear cataract, left eye: Secondary | ICD-10-CM | POA: Diagnosis not present

## 2022-07-02 ENCOUNTER — Encounter: Payer: Self-pay | Admitting: Ophthalmology

## 2022-07-02 NOTE — Anesthesia Preprocedure Evaluation (Signed)
Anesthesia Evaluation  Patient identified by MRN, date of birth, ID band Patient awake    Reviewed: Allergy & Precautions, H&P , NPO status , Patient's Chart, lab work & pertinent test results  Airway Mallampati: II  TM Distance: >3 FB Neck ROM: Full    Dental no notable dental hx. (+) Edentulous Upper   Pulmonary neg pulmonary ROS, sleep apnea , former smoker   Pulmonary exam normal breath sounds clear to auscultation       Cardiovascular hypertension, negative cardio ROS Normal cardiovascular exam Rhythm:Regular Rate:Normal     Neuro/Psych  Neuromuscular disease negative neurological ROS  negative psych ROS   GI/Hepatic negative GI ROS, Neg liver ROS,,,  Endo/Other  negative endocrine ROS    Renal/GU negative Renal ROS  negative genitourinary   Musculoskeletal negative musculoskeletal ROS (+)    Abdominal   Peds negative pediatric ROS (+)  Hematology negative hematology ROS (+)   Anesthesia Other Findings Hypertension  History of mumps Restless leg  OSA (obstructive sleep apnea) ED (erectile dysfunction)  Prediabetes Wears hearing aid in both ears   Recent cataract surgery 06-18-22  03-08-22 Dr. Sherryll Burger 1. Bilateral hand tremors rest + postural and some action, with bradykinesia and cog-wheeling, in a patient with Family History of Parkinson's disease in maternal uncle - concerning for Parkinsonism. Symptoms improved with Carbidopa-levodopa but tend to get worse by the end of day.  Reproductive/Obstetrics negative OB ROS                             Anesthesia Physical Anesthesia Plan  ASA: 3  Anesthesia Plan: MAC   Post-op Pain Management:    Induction: Intravenous  PONV Risk Score and Plan:   Airway Management Planned: Natural Airway and Nasal Cannula  Additional Equipment:   Intra-op Plan:   Post-operative Plan:   Informed Consent: I have reviewed the patients  History and Physical, chart, labs and discussed the procedure including the risks, benefits and alternatives for the proposed anesthesia with the patient or authorized representative who has indicated his/her understanding and acceptance.     Dental Advisory Given  Plan Discussed with: Anesthesiologist, CRNA and Surgeon  Anesthesia Plan Comments: (Patient consented for risks of anesthesia including but not limited to:  - adverse reactions to medications - damage to eyes, teeth, lips or other oral mucosa - nerve damage due to positioning  - sore throat or hoarseness - Damage to heart, brain, nerves, lungs, other parts of body or loss of life  Patient voiced understanding.)       Anesthesia Quick Evaluation

## 2022-07-03 NOTE — Discharge Instructions (Signed)

## 2022-07-05 ENCOUNTER — Ambulatory Visit
Admission: RE | Admit: 2022-07-05 | Discharge: 2022-07-05 | Disposition: A | Discharge status: Home / Self Care | Payer: PPO | Source: Ambulatory Visit | Attending: Ophthalmology | Admitting: Ophthalmology

## 2022-07-05 ENCOUNTER — Encounter: Payer: Self-pay | Admitting: Ophthalmology

## 2022-07-05 ENCOUNTER — Ambulatory Visit: Payer: PPO | Admitting: Anesthesiology

## 2022-07-05 ENCOUNTER — Other Ambulatory Visit: Payer: Self-pay

## 2022-07-05 ENCOUNTER — Encounter
Admission: RE | Disposition: A | Discharge status: Home / Self Care | Payer: Self-pay | Source: Ambulatory Visit | Attending: Ophthalmology

## 2022-07-05 DIAGNOSIS — H2512 Age-related nuclear cataract, left eye: Secondary | ICD-10-CM | POA: Insufficient documentation

## 2022-07-05 DIAGNOSIS — G4733 Obstructive sleep apnea (adult) (pediatric): Secondary | ICD-10-CM | POA: Diagnosis not present

## 2022-07-05 DIAGNOSIS — Z87891 Personal history of nicotine dependence: Secondary | ICD-10-CM | POA: Diagnosis not present

## 2022-07-05 DIAGNOSIS — H269 Unspecified cataract: Secondary | ICD-10-CM | POA: Diagnosis not present

## 2022-07-05 DIAGNOSIS — I10 Essential (primary) hypertension: Secondary | ICD-10-CM | POA: Insufficient documentation

## 2022-07-05 DIAGNOSIS — R7303 Prediabetes: Secondary | ICD-10-CM | POA: Insufficient documentation

## 2022-07-05 DIAGNOSIS — H401122 Primary open-angle glaucoma, left eye, moderate stage: Secondary | ICD-10-CM | POA: Insufficient documentation

## 2022-07-05 HISTORY — DX: Presence of external hearing-aid: Z97.4

## 2022-07-05 HISTORY — PX: CATARACT EXTRACTION W/PHACO: SHX586

## 2022-07-05 SURGERY — PHACOEMULSIFICATION, CATARACT, WITH IOL INSERTION
Anesthesia: Monitor Anesthesia Care | Laterality: Left

## 2022-07-05 MED ORDER — BRIMONIDINE TARTRATE-TIMOLOL 0.2-0.5 % OP SOLN
OPHTHALMIC | Status: DC | PRN
  Administered 2022-07-05: 1 [drp] via OPHTHALMIC

## 2022-07-05 MED ORDER — MIDAZOLAM HCL 2 MG/2ML IJ SOLN
INTRAMUSCULAR | Status: DC | PRN
  Administered 2022-07-05: 1 mg via INTRAVENOUS

## 2022-07-05 MED ORDER — ARMC OPHTHALMIC DILATING DROPS
1.0000 | OPHTHALMIC | Status: DC | PRN
  Administered 2022-07-05 (×3): 1 via OPHTHALMIC

## 2022-07-05 MED ORDER — SIGHTPATH DOSE#1 NA CHONDROIT SULF-NA HYALURON 20-15 MG/0.5ML IO SOSY
INTRAOCULAR | Status: DC | PRN
  Administered 2022-07-05: .5 mL via INTRAOCULAR

## 2022-07-05 MED ORDER — BSS IO SOLN
INTRAOCULAR | Status: DC | PRN
  Administered 2022-07-05: 15 mL via INTRAOCULAR

## 2022-07-05 MED ORDER — SIGHTPATH DOSE#1 NA HYALUR & NA CHOND-NA HYALUR IO KIT
PACK | INTRAOCULAR | Status: DC | PRN
  Administered 2022-07-05: 1 via OPHTHALMIC

## 2022-07-05 MED ORDER — SIGHTPATH DOSE#1 BSS IO SOLN
INTRAOCULAR | Status: DC | PRN
  Administered 2022-07-05: 15 mL via INTRAOCULAR

## 2022-07-05 MED ORDER — TETRACAINE HCL 0.5 % OP SOLN
1.0000 [drp] | OPHTHALMIC | Status: DC | PRN
  Administered 2022-07-05 (×2): 1 [drp] via OPHTHALMIC

## 2022-07-05 MED ORDER — LACTATED RINGERS IV SOLN: INTRAVENOUS | Status: DC

## 2022-07-05 MED ORDER — SIGHTPATH DOSE#1 BSS IO SOLN
INTRAOCULAR | Status: DC | PRN
  Administered 2022-07-05: 74 mL via OPHTHALMIC

## 2022-07-05 MED ORDER — LIDOCAINE HCL (PF) 2 % IJ SOLN
INTRAOCULAR | Status: DC | PRN
  Administered 2022-07-05: 4 mL via INTRAOCULAR

## 2022-07-05 MED ORDER — FENTANYL CITRATE (PF) 100 MCG/2ML IJ SOLN
INTRAMUSCULAR | Status: DC | PRN
  Administered 2022-07-05: 50 ug via INTRAVENOUS

## 2022-07-05 MED ORDER — MOXIFLOXACIN HCL 0.5 % OP SOLN
OPHTHALMIC | Status: DC | PRN
  Administered 2022-07-05: .2 mL via OPHTHALMIC

## 2022-07-05 SURGICAL SUPPLY — 10 items
BLADE DUAL KAHOOK SINGLE USE (BLADE) IMPLANT
CATARACT SUITE SIGHTPATH (MISCELLANEOUS) ×1 IMPLANT
DISSECTOR HYDRO NUCLEUS 50X22 (MISCELLANEOUS) ×1 IMPLANT
DRSG TEGADERM 2-3/8X2-3/4 SM (GAUZE/BANDAGES/DRESSINGS) ×1 IMPLANT
FEE CATARACT SUITE SIGHTPATH (MISCELLANEOUS) ×1 IMPLANT
GLOVE SURG SYN 7.5 E (GLOVE) ×1 IMPLANT
GLOVE SURG SYN 7.5 PF PI (GLOVE) ×1 IMPLANT
GLOVE SURG SYN 8.5 E (GLOVE) ×1 IMPLANT
GLOVE SURG SYN 8.5 PF PI (GLOVE) ×1 IMPLANT
LENS IOL TECNIS EYHANCE 20.0 (Intraocular Lens) IMPLANT

## 2022-07-05 NOTE — H&P (Signed)
[facility]   Primary Care Physician:  Malva Limes, MD Ophthalmologist: Dr. Deberah Pelton  Pre-Procedure History & Physical: HPI:  Alexander Espinoza is a 82 y.o. male here for cataract surgery.   Past Medical History:  Diagnosis Date   ED (erectile dysfunction)    History of mumps    Hypertension    OSA (obstructive sleep apnea)    No CPAP   Prediabetes    Restless leg    Wears hearing aid in both ears     Past Surgical History:  Procedure Laterality Date   CATARACT EXTRACTION W/PHACO Right 06/18/2022   Procedure: CATARACT EXTRACTION PHACO AND INTRAOCULAR LENS PLACEMENT (IOC) RIGHT [place] DUAL BLADE GONIOTOMY  9.20  01:03.1;  Surgeon: Estanislado Pandy, MD;  Location: [facility];  Service: Ophthalmology;  Laterality: Right;   Sleep study  04/24/2013   AHI=15.9, RDI=17.1 on MSPG    Prior to Admission medications   Medication Sig Start Date End Date Taking? Authorizing Provider  carbidopa-levodopa (SINEMET IR) 25-100 MG tablet Take by mouth 3 (three) times daily. 09/21/20  Yes Lonell Face, MD  losartan-hydrochlorothiazide (HYZAAR) 100-25 MG tablet TAKE 1 TABLET BY MOUTH DAILY 06/09/21  Yes Malva Limes, MD  metoprolol succinate (TOPROL-XL) 100 MG 24 hr tablet TAKE (1) TABLET BY MOUTH EVERY DAY 04/23/22  Yes Malva Limes, MD  terbinafine (LAMISIL) 250 MG tablet Take 1 tablet (250 mg total) by mouth daily. 03/15/21  Yes Malva Limes, MD  vitamin B-12 (CYANOCOBALAMIN) 500 MCG tablet Take 500 mcg by mouth daily.   Yes [provider]  acetaminophen (TYLENOL) 500 MG tablet Take 2 tablets (1,000 mg total) by mouth every 6 (six) hours as needed for moderate pain. 09/19/14   Betancourt, Jarold Song, NP  amLODipine (NORVASC) 5 MG tablet TAKE 1 TABLET(5 MG) BY MOUTH DAILY 05/07/22   Malva Limes, MD  latanoprost (XALATAN) 0.005 % ophthalmic solution INSTILL 1 DROP IN OU QHS 02/14/18   [provider]    Allergies as of 05/02/2022   (No Known  Allergies)    Family History  Problem Relation Age of Onset   Prostate cancer Father    Stroke Sister    Parkinsonism Maternal Uncle     Social History   Socioeconomic History   Marital status: Married    Spouse name: Not on file   Number of children: 2   Years of education: Not on file   Highest education level: Some college, no degree  Occupational History   Occupation: retired  Tobacco Use   Smoking status: Former    Packs/day: 1.00    Years: 13.00    Additional pack years: 0.00    Total pack years: 13.00    Types: Cigarettes    Quit date: 01/09/1968    Years since quitting: 54.5   Smokeless tobacco: Never  Vaping Use   Vaping Use: Never used  Substance and Sexual Activity   Alcohol use: Yes    Alcohol/week: 0.0 standard drinks of alcohol    Comment: one beer every once in a while   Drug use: No   Sexual activity: Not on file  Other Topics Concern   Not on file  Social History Narrative   Not on file   Social Determinants of Health   Financial Resource Strain: Low Risk  (02/21/2022)   Overall Financial Resource Strain (CARDIA)    Difficulty of Paying Living Expenses: Not hard at all  Food Insecurity:  No Food Insecurity (02/21/2022)   Hunger Vital Sign    Worried About Running Out of Food in the Last Year: Never true    Ran Out of Food in the Last Year: Never true  Transportation Needs: No Transportation Needs (02/21/2022)   PRAPARE - Administrator, Civil Service (Medical): No    Lack of Transportation (Non-Medical): No  Physical Activity: Inactive (02/21/2022)   Exercise Vital Sign    Days of Exercise per Week: 0 days    Minutes of Exercise per Session: 0 min  Stress: No Stress Concern Present (02/21/2022)   [employer] of Occupational Health - Occupational Stress Questionnaire    Feeling of Stress : Not at all  Social Connections: Patient Declined (02/21/2022)   Social Connection and Isolation Panel [NHANES]    Frequency of Communication  with Friends and Family: Patient declined    Frequency of Social Gatherings with Friends and Family: Patient declined    Attends Religious Services: Patient declined    Database administrator or Organizations: Patient declined    Attends Banker Meetings: Patient declined    Marital Status: Patient declined  Intimate Partner Violence: Not At Risk (02/21/2022)   Humiliation, Afraid, Rape, and Kick questionnaire    Fear of Current or Ex-Partner: No    Emotionally Abused: No    Physically Abused: No    Sexually Abused: No    Review of Systems: See HPI, otherwise negative ROS  Physical Exam: Ht 5\' 8"  (1.727 m)   Wt 65.8 kg   BMI 22.05 kg/m  General:   Alert, cooperative in NAD Head:  Normocephalic and atraumatic. Respiratory:  Normal work of breathing. Cardiovascular:  RRR  Impression/Plan: Alexander Espinoza is here for cataract surgery.  Risks, benefits, limitations, and alternatives regarding cataract surgery have been reviewed with the patient.  Questions have been answered.  All parties agreeable.   Estanislado Pandy, MD  07/05/2022, 7:09 AM

## 2022-07-05 NOTE — Anesthesia Postprocedure Evaluation (Signed)
Anesthesia Post Note  Patient: LYNDLE PANG  Procedure(s) Performed: CATARACT EXTRACTION PHACO AND INTRAOCULAR LENS PLACEMENT (IOC) LEFT [place] DUAL BLADE GONIOTOMY 16.10 01:26.7 (Left)  Patient location during evaluation: PACU Anesthesia Type: MAC Level of consciousness: awake and alert Pain management: pain level controlled Vital Signs Assessment: post-procedure vital signs reviewed and stable Respiratory status: spontaneous breathing, nonlabored ventilation, respiratory function stable and patient connected to nasal cannula oxygen Cardiovascular status: stable and blood pressure returned to baseline Postop Assessment: no apparent nausea or vomiting Anesthetic complications: no   No notable events documented.   Last Vitals:  Vitals:   07/05/22 0939 07/05/22 0940  BP:  117/70  Pulse: 60 60  Resp: 20 (!) 24  Temp:    SpO2: 98% 98%    Last Pain:  Vitals:   07/05/22 0940  TempSrc:   PainSc: 0-No pain                 [employer] C Pj Zehner

## 2022-07-05 NOTE — Transfer of Care (Signed)
Immediate Anesthesia Transfer of Care Note  Patient: Alexander Espinoza  Procedure(s) Performed: CATARACT EXTRACTION PHACO AND INTRAOCULAR LENS PLACEMENT (IOC) LEFT [place] DUAL BLADE GONIOTOMY 16.10 01:26.7 (Left)  Patient Location: PACU  Anesthesia Type: MAC  Level of Consciousness: awake, alert  and patient cooperative  Airway and Oxygen Therapy: Patient Spontanous Breathing and Patient connected to supplemental oxygen  Post-op Assessment: Post-op Vital signs reviewed, Patient's Cardiovascular Status Stable, Respiratory Function Stable, Patent Airway and No signs of Nausea or vomiting  Post-op Vital Signs: Reviewed and stable  Complications: No notable events documented.

## 2022-07-05 NOTE — Op Note (Signed)
OPERATIVE NOTE  Alexander Espinoza [id number] 07/05/2022   PREOPERATIVE DIAGNOSIS: Nuclear sclerotic cataract left eye. H25.12  Moderate stage Primary Open Angle Glaucoma left eye H40.1122   POSTOPERATIVE DIAGNOSIS: Nuclear sclerotic cataract left eye. 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 Item Serial No. Manufacturer Lot No. LRB No. Used Action  LENS IOL TECNIS EYHANCE 20.0 - [id number] Intraocular Lens LENS IOL TECNIS EYHANCE 20.0 [id number] SIGHTPATH  Left 1 Implanted      SURGEON:  Julious Payer. Rolley Sims, MD   ANESTHESIA:  Topical with tetracaine drops, augmented with 1% preservative-free intracameral lidocaine.   COMPLICATIONS:  None.   DESCRIPTION OF PROCEDURE:  The patient was identified in the holding room and transported to the operating room and placed in the supine position under the operating microscope.  The left eye was identified as the operative eye, which was prepped and draped in the usual sterile ophthalmic fashion.   A 1 millimeter clear-corneal paracentesis was made inferotemporally. Preservative-free 1% lidocaine mixed with 1:1,000 bisulfite-free aqueous solution of epinephrine was injected into the anterior chamber. The anterior chamber was then filled with Viscoat viscoelastic. A 2.4 millimeter keratome was used to make a clear-corneal incision superotemporally. A curvilinear capsulorrhexis was made with a cystotome and capsulorrhexis forceps.   The microscope was adjusted and a gonioprism was used to visualize the trabecular meshwork.  The [facility] Dual Blade was advanced across the anterior chamber under viscoelastic.  The  blade was used to mark the trabecular meshwork at the 7:30 position.  The blade was placed two clock hours clockwise into the meshwork.  Proper positioning was confirmed.  The blade was passed counterclockwise through the meshwork to excise approximately two to three clock-hours of trabecular meshwork. The microscope was adjusted back to its original position.  Balanced salt solution was used to hydrodissect and hydrodelineate the nucleus. Phacoemulsification was then used to remove the lens nucleus and epinucleus. The remaining cortex was then removed using the irrigation and aspiration handpiece. Provisc was then placed into the capsular bag to distend it for lens placement. A +20.00 D DIB00 intraocular lens was then injected into the capsular bag. The remaining viscoelastic was aspirated.   Wounds were hydrated with balanced salt solution.  The anterior chamber was inflated to a physiologic pressure with balanced salt solution.  No wound leaks were noted. Vigamox was injected itnracamerally.  Timolol and Brimonidine drops were applied to the eye.  The patient was taken to the recovery room in stable condition without complications of anesthesia or surgery.  [name] 07/05/2022, 9:32 AM

## 2022-07-06 ENCOUNTER — Encounter: Payer: Self-pay | Admitting: Ophthalmology

## 2022-07-23 DIAGNOSIS — L578 Other skin changes due to chronic exposure to nonionizing radiation: Secondary | ICD-10-CM | POA: Diagnosis not present

## 2022-07-23 DIAGNOSIS — L308 Other specified dermatitis: Secondary | ICD-10-CM | POA: Diagnosis not present

## 2022-07-23 DIAGNOSIS — C44311 Basal cell carcinoma of skin of nose: Secondary | ICD-10-CM | POA: Diagnosis not present

## 2022-07-23 DIAGNOSIS — D485 Neoplasm of uncertain behavior of skin: Secondary | ICD-10-CM | POA: Diagnosis not present

## 2022-07-23 DIAGNOSIS — L570 Actinic keratosis: Secondary | ICD-10-CM | POA: Diagnosis not present

## 2022-07-27 DIAGNOSIS — Z961 Presence of intraocular lens: Secondary | ICD-10-CM | POA: Diagnosis not present

## 2022-08-03 ENCOUNTER — Encounter: Payer: Self-pay | Admitting: Family Medicine

## 2022-08-03 ENCOUNTER — Ambulatory Visit (INDEPENDENT_AMBULATORY_CARE_PROVIDER_SITE_OTHER): Payer: PPO | Admitting: Family Medicine

## 2022-08-03 VITALS — BP 105/70 | HR 56 | Temp 97.7°F | Ht 67.99 in | Wt 141.4 lb

## 2022-08-03 DIAGNOSIS — Z125 Encounter for screening for malignant neoplasm of prostate: Secondary | ICD-10-CM

## 2022-08-03 DIAGNOSIS — G5793 Unspecified mononeuropathy of bilateral lower limbs: Secondary | ICD-10-CM | POA: Diagnosis not present

## 2022-08-03 DIAGNOSIS — I10 Essential (primary) hypertension: Secondary | ICD-10-CM | POA: Diagnosis not present

## 2022-08-03 DIAGNOSIS — R7303 Prediabetes: Secondary | ICD-10-CM | POA: Diagnosis not present

## 2022-08-03 DIAGNOSIS — R634 Abnormal weight loss: Secondary | ICD-10-CM

## 2022-08-03 MED ORDER — LOSARTAN POTASSIUM-HCTZ 50-12.5 MG PO TABS
1.0000 | ORAL_TABLET | Freq: Every day | ORAL | 1 refills | Status: DC
Start: 2022-08-03 — End: 2023-02-25

## 2022-08-03 NOTE — Progress Notes (Unsigned)
Established patient visit   Patient: Alexander Espinoza   DOB: 03-Feb-1940   82 y.o. Male  MRN: [id number] Visit Date: 08/03/2022  Today's healthcare provider: Mila Merry, MD   Chief Complaint  Patient presents with   Weight Loss    Has been losing weight since he got new dentures    Subjective    Discussed the use of AI scribe software for clinical note transcription with the patient, who gave verbal consent to proceed.  History of Present Illness   The patient, with a known history of Parkinson's disease managed with Sinemet, presents with significant weight loss, dropping from a 36 to a 32 waist size. This weight loss started prior to the acquisition of dentures, which have since been causing some difficulty with eating. The patient denies any associated gastrointestinal symptoms such as stool changes, cramping or nausea.  The patient has also experienced two episodes of lightheadedness within the past two weeks, with no associated loss of consciousness. He denies any changes in bowel habits or blood in stool. There is a reported difficulty swallowing leafy foods, but this is attributed to the new dentures.  The patient's blood pressure was noted to be low during the visit, but it is not regularly monitored at home. He is currently on Losartan-hctz for hypertension. The patient has a family history of diabetes and has had slightly elevated blood sugar levels in the past. He has also undergone two cataract surgeries.     Wt Readings from Last 10 Encounters:  08/03/22 141 lb 6.4 oz (64.1 kg)  07/05/22 141 lb 1.6 oz (64 kg)  06/18/22 141 lb 9.6 oz (64.2 kg)  02/21/22 146 lb (66.2 kg)  02/02/22 147 lb 3.2 oz (66.8 kg)  02/06/21 157 lb (71.2 kg)  09/23/20 155 lb (70.3 kg)  03/22/20 171 lb 3.2 oz (77.7 kg)  11/20/19 179 lb (81.2 kg)  10/21/18 189 lb 3.2 oz (85.8 kg)     Medications: Outpatient Medications Prior to Visit  Medication Sig Note   acetaminophen (TYLENOL) 500  MG tablet Take 2 tablets (1,000 mg total) by mouth every 6 (six) hours as needed for moderate pain.    amLODipine (NORVASC) 5 MG tablet TAKE 1 TABLET(5 MG) BY MOUTH DAILY    carbidopa-levodopa (SINEMET IR) 25-100 MG tablet Take by mouth 3 (three) times daily.    latanoprost (XALATAN) 0.005 % ophthalmic solution INSTILL 1 DROP IN OU QHS    metoprolol succinate (TOPROL-XL) 100 MG 24 hr tablet TAKE (1) TABLET BY MOUTH EVERY DAY    terbinafine (LAMISIL) 250 MG tablet Take 1 tablet (250 mg total) by mouth daily.    vitamin B-12 (CYANOCOBALAMIN) 500 MCG tablet Take 500 mcg by mouth daily.    [DISCONTINUED] losartan-hydrochlorothiazide (HYZAAR) 100-25 MG tablet TAKE 1 TABLET BY MOUTH DAILY 08/03/2022: lower dose due to dizziness   No facility-administered medications prior to visit.     Objective    BP 105/70   Pulse (!) 56   Temp 97.7 F (36.5 C) (Oral)   Ht 5' 7.99" (1.727 m)   Wt 141 lb 6.4 oz (64.1 kg)   SpO2 99%   BMI 21.50 kg/m    Physical Exam   CARDIOVASCULAR: Heart sounds normal on auscultation. ABDOMEN: No abdominal swelling or tenderness noted.    No results found for any visits on 08/03/22.  Assessment & Plan     Assessment and Plan    Unintentional Weight Loss: Significant weight loss noted  with change in waist size from 36 to 32. No associated GI symptoms such as nausea, vomiting, or changes in bowel habits. Possible contributing factors include recent denture placement and Sinemet use for Parkinson's disease. -Order comprehensive blood work including complete blood count, thyroid function tests, PSA, and blood glucose levels. -Consider referral to a gastroenterologist and/or swallow studies depending on blood work results.  Parkinson's Disease: Currently managed with Sinemet. No changes in medication regimen. -Continue current management with Sinemet. -Follow-up appointment with Dr. Sherryll Burger on Monday as already scheduled.   Hypotension: Episodes of lightheadedness  reported, possibly related to current antihypertensive medication (Losartan 100mg ). -Reduce Losartan dose to 50/12.5 mg daily. -Send new prescription to [facility]. -Advise patient to monitor blood pressure at home.          Mila Merry, MD  [facility] [phone] (phone) [phone] (fax)  [facility]

## 2022-08-03 NOTE — Patient Instructions (Signed)
.   Please review the attached list of medications and notify my office if there are any errors.   . Please bring all of your medications to every appointment so we can make sure that our medication list is the same as yours.   

## 2022-08-06 DIAGNOSIS — R29810 Facial weakness: Secondary | ICD-10-CM | POA: Diagnosis not present

## 2022-08-06 DIAGNOSIS — G20C Parkinsonism, unspecified: Secondary | ICD-10-CM | POA: Diagnosis not present

## 2022-08-06 DIAGNOSIS — R29818 Other symptoms and signs involving the nervous system: Secondary | ICD-10-CM | POA: Diagnosis not present

## 2022-08-06 DIAGNOSIS — I951 Orthostatic hypotension: Secondary | ICD-10-CM | POA: Diagnosis not present

## 2022-08-13 ENCOUNTER — Other Ambulatory Visit: Payer: Self-pay | Admitting: Student

## 2022-08-13 DIAGNOSIS — R29810 Facial weakness: Secondary | ICD-10-CM

## 2022-08-14 ENCOUNTER — Other Ambulatory Visit: Payer: Self-pay | Admitting: Student

## 2022-08-14 DIAGNOSIS — R29810 Facial weakness: Secondary | ICD-10-CM

## 2022-08-17 ENCOUNTER — Other Ambulatory Visit: Payer: Self-pay | Admitting: Family Medicine

## 2022-08-17 DIAGNOSIS — I10 Essential (primary) hypertension: Secondary | ICD-10-CM

## 2022-08-22 ENCOUNTER — Ambulatory Visit
Admission: RE | Admit: 2022-08-22 | Discharge: 2022-08-22 | Disposition: A | Discharge status: Home / Self Care | Payer: PPO | Source: Ambulatory Visit | Attending: Student | Admitting: Student

## 2022-08-22 DIAGNOSIS — I6782 Cerebral ischemia: Secondary | ICD-10-CM | POA: Diagnosis not present

## 2022-08-22 DIAGNOSIS — G319 Degenerative disease of nervous system, unspecified: Secondary | ICD-10-CM | POA: Diagnosis not present

## 2022-08-22 DIAGNOSIS — R29810 Facial weakness: Secondary | ICD-10-CM | POA: Diagnosis not present

## 2022-08-27 DIAGNOSIS — C44311 Basal cell carcinoma of skin of nose: Secondary | ICD-10-CM | POA: Diagnosis not present

## 2022-08-27 DIAGNOSIS — C4491 Basal cell carcinoma of skin, unspecified: Secondary | ICD-10-CM | POA: Diagnosis not present

## 2022-09-12 DIAGNOSIS — L570 Actinic keratosis: Secondary | ICD-10-CM | POA: Diagnosis not present

## 2022-10-05 DIAGNOSIS — H401122 Primary open-angle glaucoma, left eye, moderate stage: Secondary | ICD-10-CM | POA: Diagnosis not present

## 2022-10-10 DIAGNOSIS — L570 Actinic keratosis: Secondary | ICD-10-CM | POA: Diagnosis not present

## 2022-10-12 DIAGNOSIS — H401112 Primary open-angle glaucoma, right eye, moderate stage: Secondary | ICD-10-CM | POA: Diagnosis not present

## 2022-10-12 DIAGNOSIS — H401122 Primary open-angle glaucoma, left eye, moderate stage: Secondary | ICD-10-CM | POA: Diagnosis not present

## 2022-11-19 DIAGNOSIS — L570 Actinic keratosis: Secondary | ICD-10-CM | POA: Diagnosis not present

## 2022-11-20 ENCOUNTER — Other Ambulatory Visit: Payer: Self-pay | Admitting: Family Medicine

## 2022-11-20 DIAGNOSIS — I10 Essential (primary) hypertension: Secondary | ICD-10-CM

## 2022-11-21 DIAGNOSIS — H6123 Impacted cerumen, bilateral: Secondary | ICD-10-CM | POA: Diagnosis not present

## 2022-11-21 DIAGNOSIS — H903 Sensorineural hearing loss, bilateral: Secondary | ICD-10-CM | POA: Diagnosis not present

## 2022-11-29 ENCOUNTER — Other Ambulatory Visit: Payer: Self-pay | Admitting: Family Medicine

## 2022-11-29 DIAGNOSIS — I10 Essential (primary) hypertension: Secondary | ICD-10-CM

## 2022-12-19 DIAGNOSIS — L570 Actinic keratosis: Secondary | ICD-10-CM | POA: Diagnosis not present

## 2023-01-18 DIAGNOSIS — H401132 Primary open-angle glaucoma, bilateral, moderate stage: Secondary | ICD-10-CM | POA: Diagnosis not present

## 2023-01-25 DIAGNOSIS — L570 Actinic keratosis: Secondary | ICD-10-CM | POA: Diagnosis not present

## 2023-02-06 DIAGNOSIS — R634 Abnormal weight loss: Secondary | ICD-10-CM | POA: Diagnosis not present

## 2023-02-06 DIAGNOSIS — R29810 Facial weakness: Secondary | ICD-10-CM | POA: Diagnosis not present

## 2023-02-06 DIAGNOSIS — G20C Parkinsonism, unspecified: Secondary | ICD-10-CM | POA: Diagnosis not present

## 2023-02-06 DIAGNOSIS — I951 Orthostatic hypotension: Secondary | ICD-10-CM | POA: Diagnosis not present

## 2023-02-06 DIAGNOSIS — G4733 Obstructive sleep apnea (adult) (pediatric): Secondary | ICD-10-CM | POA: Diagnosis not present

## 2023-02-16 ENCOUNTER — Other Ambulatory Visit: Payer: Self-pay | Admitting: Family Medicine

## 2023-02-16 DIAGNOSIS — I10 Essential (primary) hypertension: Secondary | ICD-10-CM

## 2023-02-25 ENCOUNTER — Other Ambulatory Visit: Payer: Self-pay | Admitting: Family Medicine

## 2023-02-25 DIAGNOSIS — I10 Essential (primary) hypertension: Secondary | ICD-10-CM

## 2023-02-25 DIAGNOSIS — R634 Abnormal weight loss: Secondary | ICD-10-CM

## 2023-02-27 ENCOUNTER — Ambulatory Visit: Payer: PPO | Admitting: Emergency Medicine

## 2023-02-27 VITALS — Ht 69.0 in | Wt 149.0 lb

## 2023-02-27 DIAGNOSIS — Z0000 Encounter for general adult medical examination without abnormal findings: Secondary | ICD-10-CM | POA: Diagnosis not present

## 2023-02-27 NOTE — Patient Instructions (Signed)
Alexander Espinoza , Thank you for taking time to come for your Medicare Wellness Visit. I appreciate your ongoing commitment to your health goals. Please review the following plan we discussed and let me know if I can assist you in the future.   Referrals/Orders/Follow-Ups/Clinician Recommendations: Get a tetanus shot at your convenience. I have scheduled you an appointment with Dr. Sherrie Mustache for Friday, 03/29/23 @ 3:00 pm  This is a list of the screening recommended for you and due dates:  Health Maintenance  Topic Date Due   DTaP/Tdap/Td vaccine (1 - Tdap) Never done   Zoster (Shingles) Vaccine (1 of 2) Never done   COVID-19 Vaccine (4 - 2024-25 season) 09/09/2022   Medicare Annual Wellness Visit  02/27/2024   Pneumonia Vaccine  Completed   Flu Shot  Completed   HPV Vaccine  Aged Out   Colon Cancer Screening  Discontinued    Advanced directives: (Copy Requested) Please bring a copy of your health care power of attorney and living will to the office to be added to your chart at your convenience.  Next Medicare Annual Wellness Visit scheduled for next year: Yes, 03/04/24 @ 1:10pm (phone visit)

## 2023-02-27 NOTE — Progress Notes (Signed)
Subjective:   Alexander Espinoza is a 83 y.o. male who presents for Medicare Annual/Subsequent preventive examination.  This patient declined Interactive audio and Acupuncturist. Therefore the visit was completed with audio only.   Visit Complete: Virtual I connected with  Valorie Roosevelt on 02/27/23 by a audio enabled telemedicine application and verified that I am speaking with the correct person using two identifiers.  Patient Location: Home  Provider Location: Home Office  I discussed the limitations of evaluation and management by telemedicine. The patient expressed understanding and agreed to proceed.  Vital Signs: Because this visit was a virtual/telehealth visit, some criteria may be missing or patient reported. Any vitals not documented were not able to be obtained and vitals that have been documented are patient reported.  Patient Medicare AWV questionnaire was completed by the patient on 02/20/23; I have confirmed that all information answered by patient is correct and no changes since this date.  Cardiac Risk Factors include: advanced age (>80men, >26 women);male gender;hypertension;Other (see comment), Risk factor comments: prediabetic     Objective:    Today's Vitals   02/27/23 1303  Weight: 149 lb (67.6 kg)  Height: 5\' 9"  (1.753 m)   Body mass index is 22 kg/m.     02/27/2023    1:18 PM 07/05/2022    8:06 AM 06/18/2022   11:00 AM 02/21/2022   11:59 AM 02/20/2018    2:14 PM 01/22/2017   10:09 AM 01/19/2016    9:01 AM  Advanced Directives  Does Patient Have a Medical Advance Directive? Yes Yes Yes Yes Yes Yes Yes  Type of Estate agent of [place];Living will Living will Living will  Healthcare Power of [place];Living will Healthcare Power of [place];Living will Healthcare Power of [place];Living will  Does patient want to make changes to medical advance directive? No - Patient declined  No - Patient declined      Copy of Healthcare  Power of Attorney in Chart? No - copy requested    No - copy requested No - copy requested No - copy requested    Current Medications (verified) Outpatient Encounter Medications as of 02/27/2023  Medication Sig   acetaminophen (TYLENOL) 500 MG tablet Take 2 tablets (1,000 mg total) by mouth every 6 (six) hours as needed for moderate pain.   amLODipine (NORVASC) 2.5 MG tablet TAKE TWO (2) TABLETS (5 MG TOTAL) BY MOUTH ONCE DAILY.   brimonidine-timolol (COMBIGAN) 0.2-0.5 % ophthalmic solution Place 1 drop into both eyes at bedtime.   carbidopa-levodopa (SINEMET IR) 25-100 MG tablet Take by mouth 3 (three) times daily.   latanoprost (XALATAN) 0.005 % ophthalmic solution INSTILL 1 DROP IN OU QHS   losartan-hydrochlorothiazide (HYZAAR) 50-12.5 MG tablet TAKE (1) TABLET BY MOUTH EVERY DAY   metoprolol succinate (TOPROL-XL) 100 MG 24 hr tablet TAKE (1) TABLET BY MOUTH EVERY DAY   vitamin B-12 (CYANOCOBALAMIN) 500 MCG tablet Take 500 mcg by mouth daily.   terbinafine (LAMISIL) 250 MG tablet Take 1 tablet (250 mg total) by mouth daily. (Patient not taking: Reported on 02/27/2023)   No facility-administered encounter medications on file as of 02/27/2023.    Allergies (verified) Patient has no known allergies.   History: Past Medical History:  Diagnosis Date   ED (erectile dysfunction)    History of mumps    Hypertension    OSA (obstructive sleep apnea)    No CPAP   Prediabetes    Restless leg    Wears hearing aid in both  ears    Past Surgical History:  Procedure Laterality Date   CATARACT EXTRACTION W/PHACO Right 06/18/2022   Procedure: CATARACT EXTRACTION PHACO AND INTRAOCULAR LENS PLACEMENT (IOC) RIGHT KAHOOK DUAL BLADE GONIOTOMY  9.20  01:03.1;  Surgeon: Estanislado Pandy, MD;  Location: [facility];  Service: Ophthalmology;  Laterality: Right;   CATARACT EXTRACTION W/PHACO Left 07/05/2022   Procedure: CATARACT EXTRACTION PHACO AND INTRAOCULAR LENS PLACEMENT (IOC) LEFT KAHOOK  DUAL BLADE GONIOTOMY 16.10 01:26.7;  Surgeon: Estanislado Pandy, MD;  Location: [facility];  Service: Ophthalmology;  Laterality: Left;   Sleep study  04/24/2013   AHI=15.9, RDI=17.1 on MSPG   Family History  Problem Relation Age of Onset   Stroke Mother    Prostate cancer Father    Stroke Sister    Parkinsonism Maternal Uncle    Social History   Socioeconomic History   Marital status: Married    Spouse name: Rinaldo Cloud   Number of children: 2   Years of education: Not on file   Highest education level: Associate degree: occupational, Scientist, product/process development, or vocational program  Occupational History   Occupation: retired  Tobacco Use   Smoking status: Former    Current packs/day: 0.00    Average packs/day: 0.3 packs/day for 11.0 years (2.8 ttl pk-yrs)    Types: Cigarettes    Start date: 48    Quit date: 4    Years since quitting: 53.1   Smokeless tobacco: Never  Vaping Use   Vaping status: Never Used  Substance and Sexual Activity   Alcohol use: Yes    Comment: one beer monthly or less   Drug use: No   Sexual activity: Not on file  Other Topics Concern   Not on file  Social History Narrative   Not on file   Social Drivers of Health   Financial Resource Strain: Low Risk  (02/27/2023)   Overall Financial Resource Strain (CARDIA)    Difficulty of Paying Living Expenses: Not hard at all  Food Insecurity: No Food Insecurity (02/27/2023)   Hunger Vital Sign    Worried About Running Out of Food in the Last Year: Never true    Ran Out of Food in the Last Year: Never true  Transportation Needs: No Transportation Needs (02/27/2023)   PRAPARE - Administrator, Civil Service (Medical): No    Lack of Transportation (Non-Medical): No  Physical Activity: Insufficiently Active (02/27/2023)   Exercise Vital Sign    Days of Exercise per Week: 5 days    Minutes of Exercise per Session: 20 min  Stress: Stress Concern Present (02/27/2023)   [employer] of  Occupational Health - Occupational Stress Questionnaire    Feeling of Stress : To some extent  Social Connections: Unknown (02/27/2023)   Social Connection and Isolation Panel [NHANES]    Frequency of Communication with Friends and Family: More than three times a week    Frequency of Social Gatherings with Friends and Family: More than three times a week    Attends Religious Services: Patient declined    Database administrator or Organizations: No    Attends Engineer, structural: Never    Marital Status: Married    Tobacco Counseling Counseling given: Not Answered   Clinical Intake:  Pre-visit preparation completed: Yes  Pain : No/denies pain     BMI - recorded: 22 Nutritional Status: BMI of 19-24  Normal Diabetes: No  How often do you need to have someone help you when  you read instructions, pamphlets, or other written materials from your doctor or pharmacy?: 1 - Never  Interpreter Needed?: No  Information entered by :: Tora Kindred, CMA   Activities of Daily Living    02/27/2023    1:07 PM 07/05/2022    8:05 AM  In your present state of health, do you have any difficulty performing the following activities:  Hearing? 1 0  Comment wears hearing aids   Vision? 0 0  Difficulty concentrating or making decisions? 0 0  Walking or climbing stairs? 0 0  Dressing or bathing? 0 0  Doing errands, shopping? 0   Preparing Food and eating ? N   Using the Toilet? N   In the past six months, have you accidently leaked urine? N   Do you have problems with loss of bowel control? N   Managing your Medications? N   Managing your Finances? N   Housekeeping or managing your Housekeeping? N     Patient Care Team: Malva Limes, MD as PCP - General (Family Medicine) Jesusita Oka, MD as Consulting Physician (Dermatology) Lonell Face, MD as Consulting Physician (Neurology) Fermin Schwab as Consulting Physician (Dermatology) [facility] [place] (Optometry)  Indicate any recent Medical Services you may have received from other than [facility] providers in the past year (date may be approximate).     Assessment:   This is a routine wellness examination for Chrissie Noa.  Hearing/Vision screen Hearing Screening - Comments:: Wears hearing aids Vision Screening - Comments:: Gets eye exam, Dr. Deberah Pelton at [facility] [place]   Goals Addressed             This Visit's Progress    DIET - INCREASE WATER INTAKE        Depression Screen    02/27/2023    1:17 PM 02/21/2022   11:54 AM 02/02/2022    1:49 PM 02/06/2021    2:00 PM 09/23/2020    2:08 PM 03/22/2020    1:10 PM 11/20/2019    2:11 PM  PHQ 2/9 Scores  PHQ - 2 Score 0 0 0 0 0 0 0  PHQ- 9 Score  2 2 0 0 0 0    Fall Risk    02/27/2023    1:20 PM 02/21/2022   11:53 AM 02/02/2022    1:49 PM 02/06/2021    2:00 PM 09/23/2020    2:08 PM  Fall Risk   Falls in the past year? 0 0 0 0 0  Number falls in past yr: 0 0 0 0 0  Injury with Fall? 0 1 0 0 0  Risk for fall due to : No Fall Risks No Fall Risks  No Fall Risks No Fall Risks  Follow up Falls prevention discussed;Falls evaluation completed Education provided;Falls prevention discussed  Falls evaluation completed Falls evaluation completed    MEDICARE RISK AT HOME: Medicare Risk at Home Any stairs in or around the home?: Yes If so, are there any without handrails?: No Home free of loose throw rugs in walkways, pet beds, electrical cords, etc?: Yes Adequate lighting in your home to reduce risk of falls?: Yes Life alert?: No Use of a cane, walker or w/c?: No Grab bars in the bathroom?: Yes Shower chair or bench in shower?: No Elevated toilet seat or a handicapped toilet?: Yes  TIMED UP AND GO:  Was the test performed?  No    Cognitive Function:  02/27/2023    1:21 PM 02/21/2022   12:00 PM 01/19/2016    9:09 AM  6CIT Screen  What Year? 0 points 0 points 0 points  What month? 0 points 0  points 0 points  What time? 0 points 0 points 0 points  Count back from 20 0 points 0 points 0 points  Months in reverse 4 points 2 points 0 points  Repeat phrase 2 points 2 points 6 points  Total Score 6 points 4 points 6 points    Immunizations Immunization History  Administered Date(s) Administered   Fluad Quad(high Dose 65+) 09/23/2020   Influenza, High Dose Seasonal PF 09/10/2017, 10/28/2019   Influenza-Unspecified 11/03/2014, 12/22/2016   PFIZER(Purple Top)SARS-COV-2 Vaccination 02/01/2019, 02/23/2019, 11/20/2019   Pneumococcal Conjugate-13 04/23/2013   Pneumococcal Polysaccharide-23 01/11/2011, 01/11/2012    TDAP status: Due, Education has been provided regarding the importance of this vaccine. Advised may receive this vaccine at local pharmacy or Health Dept. Aware to provide a copy of the vaccination record if obtained from local pharmacy or Health Dept. Verbalized acceptance and understanding.  Flu Vaccine status: Up to date  Pneumococcal vaccine status: Up to date  Covid-19 vaccine status: Declined, Education has been provided regarding the importance of this vaccine but patient still declined. Advised may receive this vaccine at local pharmacy or Health Dept.or vaccine clinic. Aware to provide a copy of the vaccination record if obtained from local pharmacy or Health Dept. Verbalized acceptance and understanding.  Qualifies for Shingles Vaccine? Yes   Zostavax completed No   Shingrix Completed?: No.    Education has been provided regarding the importance of this vaccine. Patient has been advised to call insurance company to determine out of pocket expense if they have not yet received this vaccine. Advised may also receive vaccine at local pharmacy or Health Dept. Verbalized acceptance and understanding. Patient declined  Screening Tests Health Maintenance  Topic Date Due   DTaP/Tdap/Td (1 - Tdap) Never done   Zoster Vaccines- Shingrix (1 of 2) Never done   INFLUENZA  VACCINE  08/09/2022   COVID-19 Vaccine (4 - 2024-25 season) 09/09/2022   Medicare Annual Wellness (AWV)  02/27/2024   Pneumonia Vaccine 55+ Years old  Completed   HPV VACCINES  Aged Out   Colonoscopy  Discontinued    Health Maintenance  Health Maintenance Due  Topic Date Due   DTaP/Tdap/Td (1 - Tdap) Never done   Zoster Vaccines- Shingrix (1 of 2) Never done   INFLUENZA VACCINE  08/09/2022   COVID-19 Vaccine (4 - 2024-25 season) 09/09/2022    Colorectal cancer screening: No longer required.   Lung Cancer Screening: (Low Dose CT Chest recommended if Age 23-80 years, 20 pack-year currently smoking OR have quit w/in 15years.) does not qualify.   Lung Cancer Screening Referral: n/a  Additional Screening:  Hepatitis C Screening: does not qualify;   Vision Screening: Recommended annual ophthalmology exams for early detection of glaucoma and other disorders of the eye.  Dental Screening: Recommended annual dental exams for proper oral hygiene   Community Resource Referral / Chronic Care Management: CRR required this visit?  No   CCM required this visit?  No     Plan:     I have personally reviewed and noted the following in the patient's chart:   Medical and social history Use of alcohol, tobacco or illicit drugs  Current medications and supplements including opioid prescriptions. Patient is not currently taking opioid prescriptions. Functional ability and status Nutritional status Physical activity  Advanced directives List of other physicians Hospitalizations, surgeries, and ER visits in previous 12 months Vitals Screenings to include cognitive, depression, and falls Referrals and appointments  In addition, I have reviewed and discussed with patient certain preventive protocols, quality metrics, and best practice recommendations. A written personalized care plan for preventive services as well as general preventive health recommendations were provided to patient.      Tora Kindred, CMA   02/27/2023   After Visit Summary: (MyChart) Due to this being a telephonic visit, the after visit summary with patients personalized plan was offered to patient via MyChart   Nurse Notes:  6 CIT Score - 6 Needs Tdap vaccine Declined Covid and Shingles vaccines. Scheduled OV for 03/29/23 @ 3:00pm (last seen 07/27/22)

## 2023-03-29 ENCOUNTER — Ambulatory Visit: Payer: PPO | Admitting: Family Medicine

## 2023-04-05 ENCOUNTER — Encounter: Payer: Self-pay | Admitting: Family Medicine

## 2023-04-05 ENCOUNTER — Ambulatory Visit (INDEPENDENT_AMBULATORY_CARE_PROVIDER_SITE_OTHER): Admitting: Family Medicine

## 2023-04-05 VITALS — BP 116/72 | HR 53 | Ht 69.0 in | Wt 133.5 lb

## 2023-04-05 DIAGNOSIS — R7303 Prediabetes: Secondary | ICD-10-CM

## 2023-04-05 DIAGNOSIS — G20C Parkinsonism, unspecified: Secondary | ICD-10-CM

## 2023-04-05 DIAGNOSIS — I10 Essential (primary) hypertension: Secondary | ICD-10-CM | POA: Diagnosis not present

## 2023-04-05 DIAGNOSIS — R531 Weakness: Secondary | ICD-10-CM

## 2023-04-05 DIAGNOSIS — R634 Abnormal weight loss: Secondary | ICD-10-CM | POA: Diagnosis not present

## 2023-04-05 NOTE — Progress Notes (Signed)
 Established patient visit   Patient: Alexander Espinoza   DOB: Sep 12, 1940   83 y.o. Male  MRN: [id number] Visit Date: 04/05/2023  Today's healthcare provider: Mila Merry, MD   Chief Complaint  Patient presents with   Follow-up    Weightloss - over 10 pounds from last visit, Pt reports sleeping more than normal   Subjective    HPI  Presents for follow up hypertension, prediabetes.  He reports feeling fatigued during the day and taking more naps in the morning. Feels fairly well when first gets up in am, but gets tired within a few hours. Appetite is fair. Continues on Sinemet prescribed by Dr. Sherryll Burger for Parkinson's.   Wt Readings from Last 10 Encounters:  04/05/23 133 lb 8 oz (60.6 kg)  02/27/23 149 lb (67.6 kg)  08/03/22 141 lb 6.4 oz (64.1 kg)  07/05/22 141 lb 1.6 oz (64 kg)  06/18/22 141 lb 9.6 oz (64.2 kg)  02/21/22 146 lb (66.2 kg)  02/02/22 147 lb 3.2 oz (66.8 kg)  02/06/21 157 lb (71.2 kg)  09/23/20 155 lb (70.3 kg)  03/22/20 171 lb 3.2 oz (77.7 kg)   Lab Results  Component Value Date   PSA1 0.3 08/03/2022   PSA1 0.4 11/20/2019   PSA1 0.5 10/21/2018   PSA 0.3 11/11/2013   BP Readings from Last 3 Encounters:  04/05/23 116/72  08/03/22 105/70  07/05/22 117/70     Medications: Outpatient Medications Prior to Visit  Medication Sig   acetaminophen (TYLENOL) 500 MG tablet Take 2 tablets (1,000 mg total) by mouth every 6 (six) hours as needed for moderate pain.   amLODipine (NORVASC) 2.5 MG tablet TAKE TWO (2) TABLETS (5 MG TOTAL) BY MOUTH ONCE DAILY.   brimonidine-timolol (COMBIGAN) 0.2-0.5 % ophthalmic solution Place 1 drop into both eyes at bedtime.   carbidopa-levodopa (SINEMET IR) 25-100 MG tablet Take by mouth 3 (three) times daily.   latanoprost (XALATAN) 0.005 % ophthalmic solution INSTILL 1 DROP IN OU QHS   losartan-hydrochlorothiazide (HYZAAR) 50-12.5 MG tablet TAKE (1) TABLET BY MOUTH EVERY DAY   metoprolol succinate (TOPROL-XL) 100 MG 24 hr  tablet TAKE (1) TABLET BY MOUTH EVERY DAY   vitamin B-12 (CYANOCOBALAMIN) 500 MCG tablet Take 500 mcg by mouth daily.   terbinafine (LAMISIL) 250 MG tablet Take 1 tablet (250 mg total) by mouth daily. (Patient not taking: Reported on 04/05/2023)   No facility-administered medications prior to visit.    Review of Systems  Constitutional:  Negative for appetite change, chills and fever.  Respiratory:  Negative for chest tightness, shortness of breath and wheezing.   Cardiovascular:  Negative for chest pain and palpitations.  Gastrointestinal:  Negative for abdominal pain, nausea and vomiting.       Objective    BP 116/72   Pulse (!) 53   Ht 5\' 9"  (1.753 m)   Wt 133 lb 8 oz (60.6 kg)   SpO2 99%   BMI 19.71 kg/m    Physical Exam   General: Appearance:    Obese male in no acute distress  Eyes:    PERRL, conjunctiva/corneas clear, EOM's intact       Lungs:     Clear to auscultation bilaterally, respirations unlabored  Heart:    Bradycardic. Normal rhythm. No murmurs, rubs, or gallops.    MS:   All extremities are intact.    Neurologic:   Awake, alert, oriented x 3. No apparent focal neurological defect.  Assessment & Plan     1. Unintentional weight loss (Primary) Possibly secondary to Parkinsonian syndrome or Sinemet. Check labs as below.   2. Prediabetes  - Hemoglobin A1c  3. Primary hypertension Getting a bit hypotensive weight weight loss. Consider reducing BP regiment.   4. Weakness  - CBC - Comprehensive metabolic panel with GFR - Magnesium - TSH - T4, free  5. Parkinsonism, unspecified Parkinsonism type (HCC) Continue routine follow up Dr. Lorin Glass, MD  [facility] [phone] (phone) [phone] (fax)  [facility]

## 2023-04-05 NOTE — Patient Instructions (Signed)
 Alexander Espinoza  Please review the attached list of medications and notify my office if there are any errors.   . Please bring all of your medications to every appointment so we can make sure that our medication list is the same as yours.

## 2023-04-06 LAB — COMPREHENSIVE METABOLIC PANEL WITH GFR
ALT: 19 IU/L (ref 0–44)
AST: 31 IU/L (ref 0–40)
Albumin: 4.1 g/dL (ref 3.8–4.8)
Alkaline Phosphatase: 102 IU/L (ref 44–121)
BUN/Creatinine Ratio: 24 (ref 10–24)
BUN: 22 mg/dL (ref 8–27)
Bilirubin Total: 0.4 mg/dL (ref 0.0–1.2)
CO2: 29 mmol/L (ref 20–29)
Calcium: 9.7 mg/dL (ref 8.6–10.2)
Chloride: 100 mmol/L (ref 96–106)
Creatinine, Ser: 0.92 mg/dL (ref 0.76–1.27)
Globulin, Total: 2.4 g/dL (ref 1.5–4.5)
Glucose: 117 mg/dL — ABNORMAL HIGH (ref 70–99)
Potassium: 4.9 mmol/L (ref 3.5–5.2)
Sodium: 142 mmol/L (ref 134–144)
Total Protein: 6.5 g/dL (ref 6.0–8.5)
eGFR: 84 mL/min/{1.73_m2} (ref >59)

## 2023-04-06 LAB — HEMOGLOBIN A1C
Est. average glucose Bld gHb Est-mCnc: 128 mg/dL
Hgb A1c MFr Bld: 6.1 % — ABNORMAL HIGH (ref 4.8–5.6)

## 2023-04-06 LAB — CBC
Hematocrit: 44.8 % (ref 37.5–51.0)
Hemoglobin: 14.9 g/dL (ref 13.0–17.7)
MCH: 31.1 pg (ref 26.6–33.0)
MCHC: 33.3 g/dL (ref 31.5–35.7)
MCV: 94 fL (ref 79–97)
Platelets: 283 10*3/uL (ref 150–450)
RBC: 4.79 x10E6/uL (ref 4.14–5.80)
RDW: 12.4 % (ref 11.6–15.4)
WBC: 8.6 10*3/uL (ref 3.4–10.8)

## 2023-04-06 LAB — TSH: TSH: 2.62 u[IU]/mL (ref 0.450–4.500)

## 2023-04-06 LAB — T4, FREE: Free T4: 1.2 ng/dL (ref 0.82–1.77)

## 2023-04-06 LAB — MAGNESIUM: Magnesium: 1.7 mg/dL (ref 1.6–2.3)

## 2023-04-08 ENCOUNTER — Telehealth: Payer: Self-pay

## 2023-04-08 DIAGNOSIS — R634 Abnormal weight loss: Secondary | ICD-10-CM

## 2023-04-08 NOTE — Telephone Encounter (Signed)
 Copied from CRM [id number]. Topic: Clinical - Lab/Test Results >> Apr 08, 2023  2:36 PM Alessandra Bevels wrote: Reason for CRM: Patients wife is calling back to go over labs results. In agreement to, schedule abdominal ultrasound to make sure there are no other GI issues.

## 2023-04-18 ENCOUNTER — Ambulatory Visit
Admission: RE | Admit: 2023-04-18 | Discharge: 2023-04-18 | Disposition: A | Discharge status: Home / Self Care | Source: Ambulatory Visit | Attending: Family Medicine | Admitting: Family Medicine

## 2023-04-18 DIAGNOSIS — R634 Abnormal weight loss: Secondary | ICD-10-CM

## 2023-04-18 DIAGNOSIS — K8020 Calculus of gallbladder without cholecystitis without obstruction: Secondary | ICD-10-CM | POA: Diagnosis not present

## 2023-04-19 ENCOUNTER — Other Ambulatory Visit: Payer: Self-pay | Admitting: Family Medicine

## 2023-04-19 DIAGNOSIS — R634 Abnormal weight loss: Secondary | ICD-10-CM

## 2023-04-19 MED ORDER — MIRTAZAPINE 15 MG PO TABS
15.0000 mg | ORAL_TABLET | Freq: Every day | ORAL | 1 refills | Status: DC
Start: 2023-04-19 — End: 2023-08-26

## 2023-05-21 DIAGNOSIS — H401112 Primary open-angle glaucoma, right eye, moderate stage: Secondary | ICD-10-CM | POA: Diagnosis not present

## 2023-05-21 DIAGNOSIS — H401122 Primary open-angle glaucoma, left eye, moderate stage: Secondary | ICD-10-CM | POA: Diagnosis not present

## 2023-05-22 ENCOUNTER — Other Ambulatory Visit: Payer: Self-pay | Admitting: Family Medicine

## 2023-05-22 DIAGNOSIS — R634 Abnormal weight loss: Secondary | ICD-10-CM

## 2023-06-07 ENCOUNTER — Encounter: Payer: Self-pay | Admitting: Family Medicine

## 2023-06-07 ENCOUNTER — Ambulatory Visit (INDEPENDENT_AMBULATORY_CARE_PROVIDER_SITE_OTHER): Admitting: Family Medicine

## 2023-06-07 VITALS — BP 116/70 | HR 52 | Resp 14 | Wt 135.7 lb

## 2023-06-07 DIAGNOSIS — I10 Essential (primary) hypertension: Secondary | ICD-10-CM

## 2023-06-07 DIAGNOSIS — R42 Dizziness and giddiness: Secondary | ICD-10-CM

## 2023-06-07 DIAGNOSIS — R634 Abnormal weight loss: Secondary | ICD-10-CM | POA: Diagnosis not present

## 2023-06-07 MED ORDER — AMLODIPINE BESYLATE 2.5 MG PO TABS: 2.5000 mg | ORAL_TABLET | Freq: Every day | ORAL | Status: DC

## 2023-06-07 NOTE — Patient Instructions (Addendum)
 Please review the attached list of medications and notify my office if there are any errors.   Reduce amlodipine  to 1 tablet a day, to make a total of 2.5mg  every day

## 2023-06-07 NOTE — Progress Notes (Signed)
 Established patient visit   Patient: Alexander Espinoza   DOB: 1941/01/04   83 y.o. Male  MRN: [id number] Visit Date: 06/07/2023  Today's healthcare provider: Jeralene Mom, MD   Chief Complaint  Patient presents with   Follow-up   Dizziness    Yesterday felt light headed   Subjective    Dizziness Associated symptoms include fatigue. Pertinent negatives include no abdominal pain, chest pain, chills, fever, nausea or vomiting.   Patient with Parkinson's here to follow up on steady unintended weight loss. Work up has been unremarkable and loss of appetite and weight have been attributed parkinson's, for which he sees Dr. Mason Sole. Reports appetite has been better the last several weeks, but having some spells of feeling light headed.   Wt Readings from Last 3 Encounters:  06/07/23 135 lb 11.2 oz (61.6 kg)  04/05/23 133 lb 8 oz (60.6 kg)  02/27/23 149 lb (67.6 kg)   Last metabolic panel Lab Results  Component Value Date   GLUCOSE 117 (H) 04/05/2023   NA 142 04/05/2023   K 4.9 04/05/2023   CL 100 04/05/2023   CO2 29 04/05/2023   BUN 22 04/05/2023   CREATININE 0.92 04/05/2023   EGFR 84 04/05/2023   CALCIUM 9.7 04/05/2023   PHOS 3.1 01/23/2016   PROT 6.5 04/05/2023   ALBUMIN 4.1 04/05/2023   LABGLOB 2.4 04/05/2023   AGRATIO 1.5 02/02/2022   BILITOT 0.4 04/05/2023   ALKPHOS 102 04/05/2023   AST 31 04/05/2023   ALT 19 04/05/2023   Lab Results  Component Value Date   HGBA1C 6.1 (H) 04/05/2023     Medications: Outpatient Medications Prior to Visit  Medication Sig   acetaminophen  (TYLENOL ) 500 MG tablet Take 2 tablets (1,000 mg total) by mouth every 6 (six) hours as needed for moderate pain.   brimonidine -timolol  (COMBIGAN ) 0.2-0.5 % ophthalmic solution Place 1 drop into both eyes at bedtime.   carbidopa-levodopa (SINEMET IR) 25-100 MG tablet Take by mouth 3 (three) times daily.   latanoprost (XALATAN) 0.005 % ophthalmic solution INSTILL 1 DROP IN OU QHS    losartan -hydrochlorothiazide (HYZAAR) 50-12.5 MG tablet TAKE (1) TABLET BY MOUTH EVERY DAY   metoprolol  succinate (TOPROL -XL) 100 MG 24 hr tablet TAKE (1) TABLET BY MOUTH EVERY DAY   mirtazapine  (REMERON ) 15 MG tablet Take 1 tablet (15 mg total) by mouth at bedtime.   vitamin B-12 (CYANOCOBALAMIN ) 500 MCG tablet Take 500 mcg by mouth daily.   [DISCONTINUED] amLODipine  (NORVASC ) 2.5 MG tablet TAKE TWO (2) TABLETS (5 MG TOTAL) BY MOUTH ONCE DAILY.   No facility-administered medications prior to visit.    Review of Systems  Constitutional:  Positive for fatigue. Negative for appetite change, chills and fever.  Respiratory:  Negative for chest tightness, shortness of breath and wheezing.   Cardiovascular:  Negative for chest pain and palpitations.  Gastrointestinal:  Negative for abdominal pain, nausea and vomiting.  Neurological:  Positive for dizziness.       Objective    BP 116/70 (BP Location: Right Arm, Patient Position: Sitting, Cuff Size: Normal)   Pulse (!) 52   Resp 14   Wt 135 lb 11.2 oz (61.6 kg)   SpO2 99%   BMI 20.04 kg/m    Physical Exam   General appearance: thin male, cooperative and in no acute distress Head: Normocephalic, without obvious abnormality, atraumatic Respiratory: Respirations even and unlabored, normal respiratory rate Extremities: All extremities are intact.  Skin: Skin color, texture, turgor normal. No  rashes seen  Psych: Appropriate mood and affect. Neurologic: Mental status: Alert, oriented to person, place, and time, thought content appropriate.    Assessment & Plan     1. Unintended weight loss (Primary) Put on a couple of pounds since last visit. Likely related to Parkinson's. Follow up neurology as scheduled.   2. Dizziness Likely orthostasis. Reduce amlodipine  as below  3. Essential hypertension Reduce from 2 a day to amLODipine  (NORVASC ) 2.5 MG tablet; Take 1 tablet (2.5 mg total) by mouth daily.   Return in about 4 months (around  10/08/2023).         Jeralene Mom, MD  [facility] [phone] (phone) [phone] (fax)  [facility]

## 2023-07-10 ENCOUNTER — Other Ambulatory Visit: Payer: Self-pay | Admitting: Family Medicine

## 2023-07-10 DIAGNOSIS — I10 Essential (primary) hypertension: Secondary | ICD-10-CM

## 2023-08-07 ENCOUNTER — Ambulatory Visit (INDEPENDENT_AMBULATORY_CARE_PROVIDER_SITE_OTHER): Admitting: Family Medicine

## 2023-08-07 ENCOUNTER — Encounter: Payer: Self-pay | Admitting: Family Medicine

## 2023-08-07 VITALS — BP 133/80 | HR 63 | Ht 69.0 in | Wt 134.7 lb

## 2023-08-07 DIAGNOSIS — K4090 Unilateral inguinal hernia, without obstruction or gangrene, not specified as recurrent: Secondary | ICD-10-CM

## 2023-08-07 NOTE — Progress Notes (Signed)
 Established patient visit   Patient: Alexander Espinoza   DOB: December 16, 1940   83 y.o. Male  MRN: [id number] Visit Date: 08/07/2023  Today's healthcare provider: Nancyann Perry, MD   Chief Complaint  Patient presents with   Mass    Onset 2 weeks. Patient states when he gets up in the morning, the lump is not there but by the end of the day it is. Located in [place] close to groin. No pain    Subjective    Discussed the use of AI scribe software for clinical note transcription with the patient, who gave verbal consent to proceed.  History of Present Illness   Alexander Espinoza is an 83 year old male with Parkinson's disease who presents with a new right lower abdominal lump. He is accompanied by his wife.  Approximately two weeks ago, he noticed a sudden appearance of a lump in the right lower abdominal area. The lump tends to decrease in size or become less noticeable in the morning after sleeping. There is no associated pain.  He has Parkinson's disease and has been experiencing increased episodes of choking and strangling when eating or drinking, which is a new development. Additionally, he has been sleeping more frequently during the day, taking naps in the morning and afternoon, and going to bed earlier in the evening. His wife notes these changes as significant.  He occasionally uses stool softeners to manage constipation, which can exacerbate the hernia.  His son has also had a hernia and required surgery.       Medications: Outpatient Medications Prior to Visit  Medication Sig   acetaminophen  (TYLENOL ) 500 MG tablet Take 2 tablets (1,000 mg total) by mouth every 6 (six) hours as needed for moderate pain.   amLODipine  (NORVASC ) 2.5 MG tablet Take 1 tablet (2.5 mg total) by mouth daily.   brimonidine -timolol  (COMBIGAN ) 0.2-0.5 % ophthalmic solution Place 1 drop into both eyes at bedtime.   carbidopa-levodopa (SINEMET IR) 25-100 MG tablet Take by mouth 3 (three) times daily.    latanoprost (XALATAN) 0.005 % ophthalmic solution INSTILL 1 DROP IN OU QHS   losartan -hydrochlorothiazide (HYZAAR) 50-12.5 MG tablet TAKE (1) TABLET BY MOUTH EVERY DAY   metoprolol  succinate (TOPROL -XL) 100 MG 24 hr tablet TAKE (1) TABLET BY MOUTH EVERY DAY   mirtazapine  (REMERON ) 15 MG tablet Take 1 tablet (15 mg total) by mouth at bedtime.   vitamin B-12 (CYANOCOBALAMIN ) 500 MCG tablet Take 500 mcg by mouth daily.   No facility-administered medications prior to visit.   Review of Systems     Objective    BP 133/80 (BP Location: Left Arm, Patient Position: Sitting, Cuff Size: Normal)   Pulse 63   Ht 5' 9 (1.753 m)   Wt 134 lb 11.2 oz (61.1 kg)   SpO2 98%   BMI 19.89 kg/m   Physical Exam  About 4cm non-tender reducible right inguinal hernia.     Assessment & Plan        Right inguinal hernia Recent onset medium to large right inguinal hernia, reducing when supine, no pain. Rapid onset and size suggest surgical evaluation. - Refer to surgeon for evaluation  - Advise to avoid heavy lifting and strenuous activities. - Recommend stool softeners to prevent straining.  Parkinson's disease Recent symptom changes include increased choking episodes and daytime sleepiness, warranting further evaluation. - Advise to discuss choking episodes and increased sleepiness with neurologist at upcoming appointment. Consider swallow study  Nancyann Perry, MD  [facility] [phone] (phone) [phone] (fax)  [facility]

## 2023-08-14 ENCOUNTER — Telehealth: Payer: Self-pay | Admitting: Surgery

## 2023-08-14 ENCOUNTER — Encounter: Payer: Self-pay | Admitting: Surgery

## 2023-08-14 ENCOUNTER — Ambulatory Visit (INDEPENDENT_AMBULATORY_CARE_PROVIDER_SITE_OTHER): Payer: Self-pay | Admitting: Surgery

## 2023-08-14 VITALS — BP 127/74 | HR 53 | Ht 69.0 in | Wt 133.0 lb

## 2023-08-14 DIAGNOSIS — K4090 Unilateral inguinal hernia, without obstruction or gangrene, not specified as recurrent: Secondary | ICD-10-CM | POA: Diagnosis not present

## 2023-08-14 NOTE — Progress Notes (Signed)
 08/14/2023  Reason for Visit:  Right inguinal hernia  Requesting Provider:  Nancyann Perry, MD  History of Present Illness: Alexander Espinoza is a 83 y.o. male presenting for evaluation of a right inguinal hernia.  The patient reports this has been present for a few weeks.  He had been recently doing some heavy lifting, including lifting a large bird feeder.  He reports feeling a bulging sensation in the right groin.  Reports soreness towards the end of the day, but denies any significant symptoms otherwise.  Denies any episodes of pain, nausea, vomiting.  He does have some constipation issues.  Reports the hernia reduces on its own when he lies down and reports there's no bulging present when he first wakes up.  Then as the day goes on the hernia bulges more.  Denies any issues in the left groin or the umbilicus.  He has a history of Parkinson's and recently reported to Dr. Perry that he was having some difficulty with swallowing some solid foods, feeling things stuck in his throat and choky.   Past Medical History: Past Medical History:  Diagnosis Date   ED (erectile dysfunction)    History of mumps    Hypertension    OSA (obstructive sleep apnea)    No CPAP   Parkinson disease (HCC)    Prediabetes    Restless leg    Wears hearing aid in both ears      Past Surgical History: Past Surgical History:  Procedure Laterality Date   CATARACT EXTRACTION W/PHACO Right 06/18/2022   Procedure: CATARACT EXTRACTION PHACO AND INTRAOCULAR LENS PLACEMENT (IOC) RIGHT KAHOOK DUAL BLADE GONIOTOMY  9.20  01:03.1;  Surgeon: Enola Feliciano Hugger, MD;  Location: [facility];  Service: Ophthalmology;  Laterality: Right;   CATARACT EXTRACTION W/PHACO Left 07/05/2022   Procedure: CATARACT EXTRACTION PHACO AND INTRAOCULAR LENS PLACEMENT (IOC) LEFT KAHOOK DUAL BLADE GONIOTOMY 16.10 01:26.7;  Surgeon: Enola Feliciano Hugger, MD;  Location: [facility];  Service: Ophthalmology;  Laterality: Left;    Sleep study  04/24/2013   AHI=15.9, RDI=17.1 on MSPG    Home Medications: Prior to Admission medications   Medication Sig Start Date End Date Taking? Authorizing Provider  acetaminophen  (TYLENOL ) 500 MG tablet Take 2 tablets (1,000 mg total) by mouth every 6 (six) hours as needed for moderate pain. 09/19/14  Yes Betancourt, Ellouise LABOR, NP  amLODipine  (NORVASC ) 2.5 MG tablet Take 1 tablet (2.5 mg total) by mouth daily. 07/10/23  Yes Perry Nancyann BRAVO, MD  brimonidine -timolol  (COMBIGAN ) 0.2-0.5 % ophthalmic solution Place 1 drop into both eyes at bedtime. 02/25/23  Yes [provider]  carbidopa-levodopa (SINEMET IR) 25-100 MG tablet Take by mouth 3 (three) times daily. 09/21/20  Yes Shah, Hemang K, MD  latanoprost (XALATAN) 0.005 % ophthalmic solution INSTILL 1 DROP IN OU QHS 02/14/18  Yes [provider]  losartan -hydrochlorothiazide (HYZAAR) 50-12.5 MG tablet TAKE (1) TABLET BY MOUTH EVERY DAY 05/22/23  Yes Perry Nancyann BRAVO, MD  metoprolol  succinate (TOPROL -XL) 100 MG 24 hr tablet TAKE (1) TABLET BY MOUTH EVERY DAY 02/25/23  Yes Perry Nancyann BRAVO, MD  mirtazapine  (REMERON ) 15 MG tablet Take 1 tablet (15 mg total) by mouth at bedtime. 04/19/23  Yes Perry Nancyann BRAVO, MD  vitamin B-12 (CYANOCOBALAMIN ) 500 MCG tablet Take 500 mcg by mouth daily.   Yes [provider]    Allergies: No Known Allergies  Social History:  reports that he quit smoking about 53 years ago. His smoking use included cigarettes.  He started smoking about 64 years ago. He has a 2.8 pack-year smoking history. He has been exposed to tobacco smoke. He has never used smokeless tobacco. He reports current alcohol use. He reports that he does not use drugs.   Family History: Family History  Problem Relation Age of Onset   Stroke Mother    Prostate cancer Father    Stroke Sister    Parkinsonism Maternal Uncle     Review of Systems: Review of Systems  Constitutional:  Negative for chills and fever.  HENT:   Negative for hearing loss.   Respiratory:  Negative for shortness of breath.   Cardiovascular:  Negative for chest pain.  Gastrointestinal:  Positive for constipation. Negative for abdominal pain, nausea and vomiting.  Genitourinary:  Negative for dysuria.  Musculoskeletal:  Negative for myalgias.  Skin:  Negative for rash.  Neurological:  Negative for dizziness.  Psychiatric/Behavioral:  Negative for depression.     Physical Exam BP 127/74   Pulse (!) 53   Ht 5' 9 (1.753 m)   Wt 133 lb (60.3 kg)   SpO2 97%   BMI 19.64 kg/m  CONSTITUTIONAL: No acute distress, well nourished. HEENT:  Normocephalic, atraumatic, extraocular motion intact. NECK: Trachea is midline, and there is no jugular venous distension.  RESPIRATORY:  Lungs are clear, and breath sounds are equal bilaterally. Normal respiratory effort without pathologic use of accessory muscles. CARDIOVASCULAR: Heart is regular without murmurs, gallops, or rubs. GI: The abdomen is soft, non-distended, non-tender to palpation.  The patient has a baseball size right inguinal hernia extending within the groin only.  It appears to contain bowel.  It is reducible.  No hernia palpable in the left groin or at the umbilicus.  MUSCULOSKELETAL:  Normal muscle strength and tone in all four extremities.  No peripheral edema or cyanosis. SKIN: Skin turgor is normal. There are no pathologic skin lesions.  NEUROLOGIC:  Motor and sensation is grossly normal.  Cranial nerves are grossly intact. PSYCH:  Alert and oriented to person, place and time. Affect is normal.  Laboratory Analysis: Labs from 04/05/23: Na 142, K 4.9, Cl 100, CO2 29, BUN 22, Cr 0.92.  LFTs within normal limits.  WBC 8.6, Hgb 14.9, Hct 44.8, Plt 283.  HgA1c 6.1.  Imaging: No results found.  Assessment and Plan: This is a 83 y.o. male with a reducible right inguinal hernia.  --Discussed with the patient the findings on exam today.  He does have a reducible right inguinal  hernia, but it is relatively large size and it may contain bowel as part of the hernia contents.  He is relatively asymptomatic except soreness at the end of the day from the bulging.  However, given the contents, discussed with him that I would recommend repairing, to prevent any worsening of his hernia, potential for incarceration or strangulation, or possible bowel obstruction.  He is in agreement. --Discussed with him the plan for a robotic assisted right inguinal hernia repair.  Discussed with her the surgery at length including the planned incisions, the ability to evaluate the left groin and repair a hernia there if present, the risks of bleeding, infection, injury to surrounding structures, that this would be an outpatient surgery, post-operative activity restrictions, pain control, and he's willing to proceed. --He has follow up with Neurology soon.  Given his symptoms of difficulty swallowing, will send clearance to Neurology team as a precaution.  Will tentatively schedule him for surgery on 08/22/23 pending clearance. --All of his questions have  been answered.  I spent 40 minutes dedicated to the care of this patient on the date of this encounter to include pre-visit review of records, face-to-face time with the patient discussing diagnosis and management, and any post-visit coordination of care.   Aloysius Sheree Plant, MD [facility]

## 2023-08-14 NOTE — Telephone Encounter (Signed)
 Patient has been advised of Pre-Admission date/time, and Surgery date at [facility].  Surgery Date: 08/22/23 Preadmission Testing Date: 08/21/23 (phone 8a-1p)  Patient informed of the scheduling process and surgery information given at time of office visit.  Patient has been made aware to call [phone], between 1-3:00pm the day before surgery, to find out what time to arrive for surgery.

## 2023-08-14 NOTE — H&P (View-Only) (Signed)
 08/14/2023  Reason for Visit:  Right inguinal hernia  Requesting Provider:  Nancyann Perry, MD  History of Present Illness: Alexander Espinoza is a 83 y.o. male presenting for evaluation of a right inguinal hernia.  The patient reports this has been present for a few weeks.  He had been recently doing some heavy lifting, including lifting a large bird feeder.  He reports feeling a bulging sensation in the right groin.  Reports soreness towards the end of the day, but denies any significant symptoms otherwise.  Denies any episodes of pain, nausea, vomiting.  He does have some constipation issues.  Reports the hernia reduces on its own when he lies down and reports there's no bulging present when he first wakes up.  Then as the day goes on the hernia bulges more.  Denies any issues in the left groin or the umbilicus.  He has a history of Parkinson's and recently reported to Dr. Perry that he was having some difficulty with swallowing some solid foods, feeling things stuck in his throat and choky.   Past Medical History: Past Medical History:  Diagnosis Date   ED (erectile dysfunction)    History of mumps    Hypertension    OSA (obstructive sleep apnea)    No CPAP   Parkinson disease (HCC)    Prediabetes    Restless leg    Wears hearing aid in both ears      Past Surgical History: Past Surgical History:  Procedure Laterality Date   CATARACT EXTRACTION W/PHACO Right 06/18/2022   Procedure: CATARACT EXTRACTION PHACO AND INTRAOCULAR LENS PLACEMENT (IOC) RIGHT KAHOOK DUAL BLADE GONIOTOMY  9.20  01:03.1;  Surgeon: Enola Feliciano Hugger, MD;  Location: [facility];  Service: Ophthalmology;  Laterality: Right;   CATARACT EXTRACTION W/PHACO Left 07/05/2022   Procedure: CATARACT EXTRACTION PHACO AND INTRAOCULAR LENS PLACEMENT (IOC) LEFT KAHOOK DUAL BLADE GONIOTOMY 16.10 01:26.7;  Surgeon: Enola Feliciano Hugger, MD;  Location: [facility];  Service: Ophthalmology;  Laterality: Left;    Sleep study  04/24/2013   AHI=15.9, RDI=17.1 on MSPG    Home Medications: Prior to Admission medications   Medication Sig Start Date End Date Taking? Authorizing Provider  acetaminophen  (TYLENOL ) 500 MG tablet Take 2 tablets (1,000 mg total) by mouth every 6 (six) hours as needed for moderate pain. 09/19/14  Yes Betancourt, Ellouise LABOR, NP  amLODipine  (NORVASC ) 2.5 MG tablet Take 1 tablet (2.5 mg total) by mouth daily. 07/10/23  Yes Perry Nancyann BRAVO, MD  brimonidine -timolol  (COMBIGAN ) 0.2-0.5 % ophthalmic solution Place 1 drop into both eyes at bedtime. 02/25/23  Yes [provider]  carbidopa-levodopa (SINEMET IR) 25-100 MG tablet Take by mouth 3 (three) times daily. 09/21/20  Yes Shah, Hemang K, MD  latanoprost (XALATAN) 0.005 % ophthalmic solution INSTILL 1 DROP IN OU QHS 02/14/18  Yes [provider]  losartan -hydrochlorothiazide (HYZAAR) 50-12.5 MG tablet TAKE (1) TABLET BY MOUTH EVERY DAY 05/22/23  Yes Perry Nancyann BRAVO, MD  metoprolol  succinate (TOPROL -XL) 100 MG 24 hr tablet TAKE (1) TABLET BY MOUTH EVERY DAY 02/25/23  Yes Perry Nancyann BRAVO, MD  mirtazapine  (REMERON ) 15 MG tablet Take 1 tablet (15 mg total) by mouth at bedtime. 04/19/23  Yes Perry Nancyann BRAVO, MD  vitamin B-12 (CYANOCOBALAMIN ) 500 MCG tablet Take 500 mcg by mouth daily.   Yes [provider]    Allergies: No Known Allergies  Social History:  reports that he quit smoking about 53 years ago. His smoking use included cigarettes.  He started smoking about 64 years ago. He has a 2.8 pack-year smoking history. He has been exposed to tobacco smoke. He has never used smokeless tobacco. He reports current alcohol use. He reports that he does not use drugs.   Family History: Family History  Problem Relation Age of Onset   Stroke Mother    Prostate cancer Father    Stroke Sister    Parkinsonism Maternal Uncle     Review of Systems: Review of Systems  Constitutional:  Negative for chills and fever.  HENT:   Negative for hearing loss.   Respiratory:  Negative for shortness of breath.   Cardiovascular:  Negative for chest pain.  Gastrointestinal:  Positive for constipation. Negative for abdominal pain, nausea and vomiting.  Genitourinary:  Negative for dysuria.  Musculoskeletal:  Negative for myalgias.  Skin:  Negative for rash.  Neurological:  Negative for dizziness.  Psychiatric/Behavioral:  Negative for depression.     Physical Exam BP 127/74   Pulse (!) 53   Ht 5' 9 (1.753 m)   Wt 133 lb (60.3 kg)   SpO2 97%   BMI 19.64 kg/m  CONSTITUTIONAL: No acute distress, well nourished. HEENT:  Normocephalic, atraumatic, extraocular motion intact. NECK: Trachea is midline, and there is no jugular venous distension.  RESPIRATORY:  Lungs are clear, and breath sounds are equal bilaterally. Normal respiratory effort without pathologic use of accessory muscles. CARDIOVASCULAR: Heart is regular without murmurs, gallops, or rubs. GI: The abdomen is soft, non-distended, non-tender to palpation.  The patient has a baseball size right inguinal hernia extending within the groin only.  It appears to contain bowel.  It is reducible.  No hernia palpable in the left groin or at the umbilicus.  MUSCULOSKELETAL:  Normal muscle strength and tone in all four extremities.  No peripheral edema or cyanosis. SKIN: Skin turgor is normal. There are no pathologic skin lesions.  NEUROLOGIC:  Motor and sensation is grossly normal.  Cranial nerves are grossly intact. PSYCH:  Alert and oriented to person, place and time. Affect is normal.  Laboratory Analysis: Labs from 04/05/23: Na 142, K 4.9, Cl 100, CO2 29, BUN 22, Cr 0.92.  LFTs within normal limits.  WBC 8.6, Hgb 14.9, Hct 44.8, Plt 283.  HgA1c 6.1.  Imaging: No results found.  Assessment and Plan: This is a 83 y.o. male with a reducible right inguinal hernia.  --Discussed with the patient the findings on exam today.  He does have a reducible right inguinal  hernia, but it is relatively large size and it may contain bowel as part of the hernia contents.  He is relatively asymptomatic except soreness at the end of the day from the bulging.  However, given the contents, discussed with him that I would recommend repairing, to prevent any worsening of his hernia, potential for incarceration or strangulation, or possible bowel obstruction.  He is in agreement. --Discussed with him the plan for a robotic assisted right inguinal hernia repair.  Discussed with her the surgery at length including the planned incisions, the ability to evaluate the left groin and repair a hernia there if present, the risks of bleeding, infection, injury to surrounding structures, that this would be an outpatient surgery, post-operative activity restrictions, pain control, and he's willing to proceed. --He has follow up with Neurology soon.  Given his symptoms of difficulty swallowing, will send clearance to Neurology team as a precaution.  Will tentatively schedule him for surgery on 08/22/23 pending clearance. --All of his questions have  been answered.  I spent 40 minutes dedicated to the care of this patient on the date of this encounter to include pre-visit review of records, face-to-face time with the patient discussing diagnosis and management, and any post-visit coordination of care.   Aloysius Sheree Plant, MD [facility]

## 2023-08-14 NOTE — Patient Instructions (Addendum)
 We do recommend repair of your inguinal hernia due to the risk of incarceration/complications of the hernia.  We will send a clearance form to your Neurologist to be sure that they are alright with you having surgery.    You have chose to have your hernia repaired. This will be done by Dr. Desiderio at [facility].  Please see your (blue) Pre-care information that you have been given today. Our surgery scheduler will call you to verify surgery date and to go over information.   You will need to arrange to be out of work for approximately 1-2 weeks and then you may return with a lifting restriction for 4 more weeks. If you have FMLA or Disability paperwork that needs to be filled out, please have your company fax your paperwork to [phone] or you may drop this by either office. This paperwork will be filled out within 3 days after your surgery has been completed.  You may have a bruise in your groin and also swelling and brusing in your testicle area. You may use ice 4-5 times daily for 15-20 minutes each time. Make sure that you place a barrier between you and the ice pack. To decrease the swelling, you may roll up a bath towel and place it vertically in between your thighs with your testicles resting on the towel. You will want to keep this area elevated as much as possible for several days following surgery.    Inguinal Hernia, Adult Muscles help keep everything in the body in its proper place. But if a weak spot in the muscles develops, something can poke through. That is called a hernia. When this happens in the lower part of the belly (abdomen), it is called an inguinal hernia. (It takes its name from a part of the body in this region called the inguinal canal.) A weak spot in the wall of muscles lets some fat or part of the small intestine bulge through. An inguinal hernia can develop at any age. Men get them more often than women. CAUSES  In adults, an inguinal hernia develops over  time. It can be triggered by: Suddenly straining the muscles of the lower abdomen. Lifting heavy objects. Straining to have a bowel movement. Difficult bowel movements (constipation) can lead to this. Constant coughing. This may be caused by smoking or lung disease. Being overweight. Being pregnant. Working at a job that requires long periods of standing or heavy lifting. Having had an inguinal hernia before. One type can be an emergency situation. It is called a strangulated inguinal hernia. It develops if part of the small intestine slips through the weak spot and cannot get back into the abdomen. The blood supply can be cut off. If that happens, part of the intestine may die. This situation requires emergency surgery. SYMPTOMS  Often, a small inguinal hernia has no symptoms. It is found when a healthcare provider does a physical exam. Larger hernias usually have symptoms.  In adults, symptoms may include: A lump in the groin. This is easier to see when the person is standing. It might disappear when lying down. In men, a lump in the scrotum. Pain or burning in the groin. This occurs especially when lifting, straining or coughing. A dull ache or feeling of pressure in the groin. Signs of a strangulated hernia can include: A bulge in the groin that becomes very painful and tender to the touch. A bulge that turns red or purple. Fever, nausea and vomiting. Inability to have  a bowel movement or to pass gas. DIAGNOSIS  To decide if you have an inguinal hernia, a healthcare provider will probably do a physical examination. This will include asking questions about any symptoms you have noticed. The healthcare provider might feel the groin area and ask you to cough. If an inguinal hernia is felt, the healthcare provider may try to slide it back into the abdomen. Usually no other tests are needed. TREATMENT  Treatments can vary. The size of the hernia makes a difference. Options  include: Watchful waiting. This is often suggested if the hernia is small and you have had no symptoms. No medical procedure will be done unless symptoms develop. You will need to watch closely for symptoms. If any occur, contact your healthcare provider right away. Surgery. This is used if the hernia is larger or you have symptoms. Open surgery. This is usually an outpatient procedure (you will not stay overnight in a hospital). An cut (incision) is made through the skin in the groin. The hernia is put back inside the abdomen. The weak area in the muscles is then repaired by herniorrhaphy or hernioplasty. Herniorrhaphy: in this type of surgery, the weak muscles are sewn back together. Hernioplasty: a patch or mesh is used to close the weak area in the abdominal wall. Laparoscopy. In this procedure, a surgeon makes small incisions. A thin tube with a tiny video camera (called a laparoscope) is put into the abdomen. The surgeon repairs the hernia with mesh by looking with the video camera and using two long instruments. HOME CARE INSTRUCTIONS  After surgery to repair an inguinal hernia: You will need to take pain medicine prescribed by your healthcare provider. Follow all directions carefully. You will need to take care of the wound from the incision. Your activity will be restricted for awhile. This will probably include no heavy lifting for several weeks. You also should not do anything too active for a few weeks. When you can return to work will depend on the type of job that you have. During watchful waiting periods, you should: Maintain a healthy weight. Eat a diet high in fiber (fruits, vegetables and whole grains). Drink plenty of fluids to avoid constipation. This means drinking enough water and other liquids to keep your urine clear or pale yellow. Do not lift heavy objects. Do not stand for long periods of time. Quit smoking. This should keep you from developing a frequent cough. SEEK  MEDICAL CARE IF:  A bulge develops in your groin area. You feel pain, a burning sensation or pressure in the groin. This might be worse if you are lifting or straining. You develop a fever of more than 100.5 F (38.1 C). SEEK IMMEDIATE MEDICAL CARE IF:  Pain in the groin increases suddenly. A bulge in the groin gets bigger suddenly and does not go down. For men, there is sudden pain in the scrotum. Or, the size of the scrotum increases. A bulge in the groin area becomes red or purple and is painful to touch. You have nausea or vomiting that does not go away. You feel your heart beating much faster than normal. You cannot have a bowel movement or pass gas. You develop a fever of more than 102.0 F (38.9 C).   This information is not intended to replace advice given to you by your health care provider. Make sure you discuss any questions you have with your health care provider.   Document Released: 05/13/2008 Document Revised: 03/19/2011 Document Reviewed: 06/28/2014  Risk analyst Patient Education [employer]

## 2023-08-14 NOTE — Progress Notes (Signed)
 Request for Neurology Clearance has been faxed to Dr Maree at [facility].

## 2023-08-16 DIAGNOSIS — G4733 Obstructive sleep apnea (adult) (pediatric): Secondary | ICD-10-CM | POA: Diagnosis not present

## 2023-08-16 DIAGNOSIS — Z1331 Encounter for screening for depression: Secondary | ICD-10-CM | POA: Diagnosis not present

## 2023-08-16 DIAGNOSIS — R29818 Other symptoms and signs involving the nervous system: Secondary | ICD-10-CM | POA: Diagnosis not present

## 2023-08-16 DIAGNOSIS — R634 Abnormal weight loss: Secondary | ICD-10-CM | POA: Diagnosis not present

## 2023-08-16 NOTE — Progress Notes (Signed)
 PCP: Gasper Nancyann BRAVO, MD Assessment and Plan:   In most patients we give written parts of assessment and plan to patient under Patient Instructions/After Visit Summary. So some parts are directed to patient.  Dear [name], It was our pleasure to participate in your care. We have typed up brief summary of what we discussed. Assessment & Plan Parkinson's disease - Bilateral Hand Tremors Exhibits bilateral hand tremors, postural tremors, bradykinesia, and cogwheel rigidity, consistent with Parkinson's disease. Family history of Parkinson's in maternal uncle. Tremors improve after an afternoon nap, likely due to increased dopamine production during rest. Positive response to carbidopa-levodopa supports diagnosis. Tremors have worsened over time. 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 Discussed perioperative care and potential risks associated with Parkinson's disease during surgery. Emphasized importance of medication management and avoiding certain anesthetics and medications that may worsen Parkinson's symptoms.  - Increase carbidopa-levodopa to 1.5 pills five times a day. - Provide written instructions for perioperative care related to Parkinson's disease. Below information is for other healthcare providers. Issue Complication Considerations for Prevention & Management   Difficulty maintaining Parkinson's disease medication schedule Exacerbation of Parkinson's disease, increased rigidity ? increased fall risk, and decreased mobilization & associated complications  Parkinsonism hyperpyrexia syndrome  Attempt to follow patients' home regiments as closely as possible with regard to time of administration & dosing   If a patient must be strictly NPO, consider alternative medications (apomorphine subcutaneously or rotigotine transdermally) & reassess ability to take oral medications often   Dysphagia, sialorrhea, dysmotility Aspiration,  pneumonia   Constipation   With sedation, risk may increase & patients with Parkinson's disease may need intubation more readily than patients without these symptoms  Incentive spirometry  Aspiration precautions; suctioning  Botulinum toxin type B for sialorrhea  Glycopyrrolate  or ipratropium spray for sialorrhea  Aggressive bowel regimen  Deep brain stimulator Damaging leads during cauterization  Controversial contraindication of MRI  Plan ahead  Consult movement disorder neurologist  Minimize electrocautery use  If MRI is indicated, contact manufacturer & radiology department to determine safety. Limited MRI sequences of the brain using 1.5 tesla coil typically are safe.   General anesthesia Certain medications might exacerbate Parkinson's disease symptoms or interact with Parkinson's disease medications Discontinue MAO-B inhibitors 1-2 weeks before surgery  Avoid halothane in patients taking levodopa because it increases cardiac sensitivity to catecholamines  Propofol  has anti-parkinsonian effects, but may aggravate dyskinesias  Opioids (especially fentanyl ) may worsen in rigidity   Postoperative nausea Certain anti-emetics can worsen/cause extrapyramidal symptoms Use domperidone, ondansetron, or trimethobenzamide  Avoid metoclopramide, promethazine, & prochlorperazine  Postoperative pain Immobility  Interactions between analgesic & anti- Parkinson's disease medications  Discontinue MAO-B inhibitor 1-2 weeks before surgery  If MAO-B inhibitor is not discontinued, opioids should be used with caution (meperidine is contraindicated)  Encourage physical therapy early on  Fluctuations in blood pressure Orthostatic hypotension  Hypertension  Adequate hydration  Blood pressure monitoring  Discontinue MAO-B inhibitor 1-2 weeks before surgery  Cognitive impairment Agitation & hallucinations  Falls  Agitation & hallucinations  Falls Avoid typical & most atypical  antipsychotics, except for quetiapine & clozapine, which will not worsen Parkinson's disease symptoms  Benzodiazepines should be used with caution  Safety precautions  Urinary retention Urinary tract infection Frequent bladder scans to assess for retention  Early removal of Foley catheter  High suspicion for infections, assess & treat if needed   Rigidity/immobility Contractures  Pressure ulcers  General deconditioning  Consider foot &  hand braces/support, physical therapy   Frequent repositioning   Adequate pharmacologic anti-parkinsonian treatment    - Set up physical therapy and occupational therapy. - Monitor for signs of Parkinson's dementia, including confusion and disorientation. - Educate on Parkinson's dementia prevention strategies, including engaging in activities to preserve brain function. - Ensure perioperative medication management, particularly carbidopa-levodopa. - Avoid medications that may exacerbate Parkinson's symptoms, such as Phenergan. - Educate the surgical team about Parkinson's disease and medication regimen. - Surgical clearance - discussed with patient and filled out a form from surgery about his hernia surgery.  2. Obstructive Sleep Apnea Obstructive sleep apnea with remote use of CPAP. Weight loss may have impacted severity of condition.  3. Unintentional Weight Loss Experienced significant unintentional weight loss, which may be contributing to exacerbation of obstructive sleep apnea and overall health status.  CONSIDERED COMORBIDITIES BELOW Left Lower Facial Weakness  6 months with Kaitlin Paich, PA-C  Return in about 6 months (around 02/16/2024) for Kaitlin Paich, PA-C.  This note has been created using automated tools and reviewed for accuracy by [facility]. I spent a total of 43 minutes in both face-to-face and non-face-to-face activities, excluding procedures performed, for this visit on the date of this encounter.  Interim History date  08/16/2023   [name] is a 83 y.o. male here for treatment and evaluation of Parkinson's Disease  [name] last visit was on 02/06/2023  Patient states his tremors are worse especially at night.  Taking sinemet 25/100 mg 5 pills daily.    History of Present Illness Alexander SPILLERS is an 83 year old male with Parkinson's disease who presents with worsening bilateral hand tremors and left lower facial weakness. He is accompanied by his caregiver, who is also his spouse.  Parkinsonian motor symptoms - Worsening bilateral hand tremors, more pronounced as the day progresses - Tremors improve after an afternoon nap - Left lower facial weakness - Currently taking carbidopa-levodopa, one pill five times daily, with symptomatic improvement in tremors  Neurocognitive symptoms - Episodes of confusion, particularly with time orientation (mixing up days and nights) - No repetitive questioning or significant memory impairment - Occasional visual hallucinations, such as seeing a black dot moving in peripheral vision  Nutritional status and weight loss - Significant unintentional weight loss - Edentulous, which may impact nutritional intake  Sleep disturbance - History of obstructive sleep apnea - Previous use of CPAP  Neuroimaging findings - MRI brain on August 22, 2022, showed minimal microvascular ischemic changes and mild generalized cerebral atrophy  I reviewed labs, imaging, and notes in [place], [place], and from outside providers, if available.   Results Brain MRI: Minimal microvascular ischemic changes, mild generalized cerebral atrophy (08/21/2021)  Disease Summary: (Aggregate of information from previous visits)   Alexander Espinoza is a right handed 83 y.o. male retiree here for evaluation of Parkinson's Disease  Parkinson's Disease - Bilateral hand tremors rest + postural and some action, with bradykinesia and cog-wheeling, in a patient with Family History of Parkinson's disease  in maternal uncle - concerning for Parkinsonism - unusual features - absence of premotor symptoms (constipation, hyposmia, REM behavior disorder), imbalance, etc. Symptoms improved with Carbidopa-levodopa but tend to get worse by the end of day: Patient states he has been having tremors in his bilateral hands since around 2020 or so. Endorses some drooling. Reports occasional times of orthostatic hypotension, noting if he stands too quickly.  Occasional vivid dreams at night.  They can occur at rest, worse when  tired. They worsen with weed eating and when he is tired. He states water makes his symptoms better.  He does not drink any caffeinated beverages.He does not know if alcoholic beverages temporarily reduce his tremors.  He has a positive family history of Parkinson's disease in his uncle (mother's brother).  He is not on any medications for his symptoms.  No history of head injuries. He has issues with his balance.   At initial evaluation Alexander Espinoza denied decreased volume of speech, difficulty swallowing, decreased facial expressions, feeling of stiffness, slowness of the movements, small hand writing, loss of sense of smell, shuffling gait, short steps, decreased arm swing, hunched forward posture, vivid dreams at night or acting out of the dreams, transient visual hallucination, depression, anxiety, constipation, urinary urgency, sexual dysfunction, drop in blood pressure with change in posture etc. Denies anosmia/hyposmia, dysgeusia, dysarthria.  Reports when driving - he is unsure of his depth (stopping 3 or 4 car or less than 1 car), predominantly at night. Wife is otherwise comfortable with his driving. Collectively, they continue to care for his mother-in-law. Patient served in [place].   Tremors improved with Carbidopa-Levodopa.   Has questions about driving, state he has multiple moments of accelerating too quickly or not enough. Does not drive often, but happens most when driving long  distances. Scheduled for cataract surgery per patient.   MRI Brain 02/10/2020 - NeuroQuant Findings: Volumetric analysis of the brain was performed, with a fully detailed report in [facility]. Briefly, the comparison with age and gender matched reference reveals decreased cortical gray matter and hippocampal volumes in the 19th and 26th normative percentile.  No acute intracranial abnormality. Left maxillary sinus disease.    Medications: Carbidopa-Levodopa    Obstructive Sleep Apnea: remote history of CPAP, minimal snoring now.   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 MRI brain shows no acute intracranial abnormality, minimal microvascular ischemic changes, and some neurodegeneration. Discussed potential benefit of starting aspirin for stroke prevention, noting minimal hardening of small blood vessels and lack of strong evidence for primary stroke prevention. Emphasized monitoring blood sugar, blood pressure, physical activity, and diet.  08/22/2022 MRI Brain without contrast - 1. No evidence of an acute intracranial abnormality on this  non-contrast examination. 2. Minimal chronic small vessel ischemic changes within the cerebral  white matter, similar to the prior brain MRI of 02/10/2020. 3. Mild generalized cerebral atrophy.   Physical Exam   Vitals Vitals:   08/16/23 0903  BP: 116/72  Pulse: 56  Weight: 61.2 kg (135 lb)  Height: 175.3 cm (5' 9)  PainSc: 0-No pain    Body mass index is 19.94 kg/m.  (Some of the exam changes noted are from previous clinical observations)  Physical Exam NEUROLOGICAL: Difficulty with rapid hand movements. Gait observed. Difficulty rising without using hands.  Neurological exam appropriate for patient's condition performed.   General Exam  Neurological Exam  08/16/23 Difficulty  with rapid hand movements.  Gait observed.  Difficulty rising without using hands.  08/06/2022 Mild left facial weakness  Other Historical Findings Bilateral hand tremors (resting + postural), cog wheeling (right > left), some bradykinesia, otherwise OK Good stride length - arm swing okay   Medications: Current Outpatient Medications on File Prior to Visit  Medication Sig Dispense Refill  . amLODIPine  (NORVASC ) 5 MG tablet Take 5 mg by mouth once daily    . cyanocobalamin  (VITAMIN B12) 500 MCG tablet Take 500 mcg by mouth once daily    . latanoprost (XALATAN) 0.005 % ophthalmic solution 1 drop nightly    . losartan -hydrochlorothiazide (HYZAAR) 100-25 mg tablet TAKE 1 TABLET BY MOUTH EVERY DAY (Patient taking differently: 0.5 tablets) 10 tablet 0  . metoprolol  succinate (TOPROL -XL) 100 MG XL tablet Take 100 mg by mouth once daily    . mirtazapine  (REMERON ) 15 MG tablet Take 15 mg by mouth at bedtime     No current facility-administered medications on file prior to visit.   Past Medical History:  Past Medical History:  Diagnosis Date  . Glaucoma (increased eye pressure)   . Hyperlipidemia   . Hypertension   . Tremor     Past Surgical History: No past surgical history on file. Family History:  Family History  Problem Relation Name Age of Onset  . Prostate cancer Father    . Diabetes type II Father    . Cancer Father    . Myocardial Infarction (Heart attack) Father    . Stroke Sister    . Coronary Artery Disease (Blocked arteries around heart) Neg Hx     Social History:  Social History   Socioeconomic History  .  Marital status: Married  Tobacco Use  . Smoking status: Former    Current packs/day: 0.00    Types: Cigarettes    Quit date: 01/08/1970    Years since quitting: 53.6  . Smokeless tobacco: Never  Vaping Use  . Vaping status: Never Used  Substance and Sexual Activity  . Alcohol use: Yes    Comment: Rarely  . Drug use: Not Currently  . Sexual activity: Not Currently  Social History Narrative   Married > 45 years. Has two boys local. [employer]. Likes [place]. [place]. Retired Firefighter as Curator.   Social Drivers of Corporate investment banker Strain: Low Risk  (02/27/2023)   Received from [facility]   Overall Financial Resource Strain (CARDIA)   . Difficulty of Paying Living Expenses: Not hard at all  Food Insecurity: No Food Insecurity (02/27/2023)   Received from [facility]   Hunger Vital Sign   . Within the past 12 months, you worried that your food would run out before you got the money to buy more.: Never true   . Within the past 12 months, the food you bought just didn't last and you didn't have money to get more.: Never true  Transportation Needs: No Transportation Needs (02/27/2023)   Received from [facility] - Transportation   . Lack of Transportation (Medical): No   . Lack of Transportation (Non-Medical): No  Physical Activity: Insufficiently Active (02/27/2023)   Received from [facility]   Exercise Vital Sign   . On average, how many days per week do you engage in moderate to strenuous exercise (like a brisk walk)?: 5 days   . On average, how many minutes do you engage in exercise at this level?: 20 min  Stress: Stress Concern Present (  02/27/2023)   Received from [facility] - Occupational Stress Questionnaire   . Feeling of Stress : To some extent  Social Connections: Unknown (02/27/2023)   Received from [facility]   Social Connection and Isolation Panel   . In a typical week, how many times  do you talk on the phone with family, friends, or neighbors?: More than three times a week   . How often do you get together with friends or relatives?: More than three times a week   . How often do you attend church or religious services?: Patient declined   . Do you belong to any clubs or organizations such as church groups, unions, fraternal or athletic groups, or school groups?: No   . How often do you attend meetings of the clubs or organizations you belong to?: Never   . Are you married, widowed, divorced, separated, never married, or living with a partner?: Married  Housing Stability: Unknown (02/06/2023)   Housing Stability Vital Sign   . Homeless in the Last Year: No   Allergies:  Allergies  Allergen Reactions  . Ace Inhibitors Cough   Dr. Jannett Fairly

## 2023-08-16 NOTE — Progress Notes (Unsigned)
 Received Neurology Clearance from Dr Maree. The patient is cleared at Low risk for surgery.

## 2023-08-21 ENCOUNTER — Encounter
Admission: RE | Admit: 2023-08-21 | Discharge: 2023-08-21 | Disposition: A | Discharge status: Home / Self Care | Source: Ambulatory Visit | Attending: Surgery | Admitting: Surgery

## 2023-08-21 ENCOUNTER — Other Ambulatory Visit: Payer: Self-pay

## 2023-08-21 DIAGNOSIS — Z01810 Encounter for preprocedural cardiovascular examination: Secondary | ICD-10-CM | POA: Diagnosis not present

## 2023-08-21 DIAGNOSIS — I10 Essential (primary) hypertension: Secondary | ICD-10-CM

## 2023-08-21 DIAGNOSIS — Z01818 Encounter for other preprocedural examination: Secondary | ICD-10-CM | POA: Insufficient documentation

## 2023-08-21 DIAGNOSIS — K4090 Unilateral inguinal hernia, without obstruction or gangrene, not specified as recurrent: Secondary | ICD-10-CM

## 2023-08-21 HISTORY — DX: Prediabetes: R73.03

## 2023-08-21 HISTORY — DX: Unilateral inguinal hernia, without obstruction or gangrene, not specified as recurrent: K40.90

## 2023-08-21 HISTORY — DX: Malignant (primary) neoplasm, unspecified: C80.1

## 2023-08-21 HISTORY — DX: Dysphagia, unspecified: R13.10

## 2023-08-21 LAB — CBC
HCT: 41.8 % (ref 39.0–52.0)
Hemoglobin: 14 g/dL (ref 13.0–17.0)
MCH: 30.8 pg (ref 26.0–34.0)
MCHC: 33.5 g/dL (ref 30.0–36.0)
MCV: 91.9 fL (ref 80.0–100.0)
Platelets: 272 K/uL (ref 150–400)
RBC: 4.55 MIL/uL (ref 4.22–5.81)
RDW: 13 % (ref 11.5–15.5)
WBC: 10.3 K/uL (ref 4.0–10.5)
nRBC: 0 % (ref 0.0–0.2)

## 2023-08-21 LAB — BASIC METABOLIC PANEL WITH GFR
Anion gap: 8 (ref 5–15)
BUN: 19 mg/dL (ref 8–23)
CO2: 31 mmol/L (ref 22–32)
Calcium: 9.2 mg/dL (ref 8.9–10.3)
Chloride: 102 mmol/L (ref 98–111)
Creatinine, Ser: 0.85 mg/dL (ref 0.61–1.24)
GFR, Estimated: >60 mL/min (ref >60)
Glucose, Bld: 155 mg/dL — ABNORMAL HIGH (ref 70–99)
Potassium: 3.5 mmol/L (ref 3.5–5.1)
Sodium: 141 mmol/L (ref 135–145)

## 2023-08-21 MED ORDER — CHLORHEXIDINE GLUCONATE CLOTH 2 % EX PADS: 6.0000 | MEDICATED_PAD | Freq: Once | CUTANEOUS | Status: DC

## 2023-08-21 MED ORDER — ACETAMINOPHEN 500 MG PO TABS
1000.0000 mg | ORAL_TABLET | ORAL | Status: AC
  Administered 2023-08-22: 1000 mg via ORAL

## 2023-08-21 MED ORDER — GABAPENTIN 100 MG PO CAPS
200.0000 mg | ORAL_CAPSULE | ORAL | Status: AC
  Administered 2023-08-22: 200 mg via ORAL

## 2023-08-21 MED ORDER — BUPIVACAINE LIPOSOME 1.3 % IJ SUSP: 10.0000 mL | Freq: Once | INTRAMUSCULAR | Status: DC

## 2023-08-21 MED ORDER — CEFAZOLIN SODIUM-DEXTROSE 2-4 GM/100ML-% IV SOLN
2.0000 g | INTRAVENOUS | Status: AC
  Administered 2023-08-22: 2 g via INTRAVENOUS

## 2023-08-21 MED ORDER — LACTATED RINGERS IV SOLN: INTRAVENOUS | Status: DC

## 2023-08-21 MED ORDER — CHLORHEXIDINE GLUCONATE 0.12 % MT SOLN
15.0000 mL | Freq: Once | OROMUCOSAL | Status: AC
  Administered 2023-08-22: 15 mL via OROMUCOSAL

## 2023-08-21 MED ORDER — ORAL CARE MOUTH RINSE: 15.0000 mL | Freq: Once | OROMUCOSAL | Status: AC

## 2023-08-21 NOTE — Patient Instructions (Addendum)
 Your procedure is scheduled on: 08/22/23 Report to the Registration Desk on the 1st floor of the [facility]. To find out your arrival time, please call [phone] between 1PM - 3PM on: 08/21/23 If your arrival time is 6:00 am, do not arrive before that time as the [facility] entrance doors do not open until 6:00 am.  REMEMBER: Instructions that are not followed completely may result in serious medical risk, up to and including death; or upon the discretion of your surgeon and anesthesiologist your surgery may need to be rescheduled.  Do not eat food after midnight the night before surgery.  No gum chewing or hard candies.  You may however, drink CLEAR liquids up to 2 hours before you are scheduled to arrive for your surgery. Do not drink anything within 2 hours of your scheduled arrival time.  Clear liquids include: - water  - apple juice without pulp - gatorade (not RED colors) - black coffee or tea (Do NOT add milk or creamers to the coffee or tea) Do NOT drink anything that is not on this list.  One week prior to surgery: Stop Anti-inflammatories (NSAIDS) such as Advil , Aleve, Ibuprofen , Motrin , Naproxen, Naprosyn and Aspirin based products such as Excedrin, Goody's Powder, BC Powder.  Stop ANY OVER THE COUNTER supplements until after surgery.  You may  continue to take Tylenol  if needed for pain up until the day of surgery.  HOLD losartan -hydrochlorothiazide the day of of surgery.  ON THE DAY OF SURGERY ONLY TAKE THESE MEDICATIONS WITH SIPS OF WATER:  amLODipine  (NORVASC )  carbidopa-levodopa (SINEMET IR)  metoprolol  succinate    No Alcohol for 24 hours before or after surgery.  No Smoking including e-cigarettes for 24 hours before surgery.  No chewable tobacco products for at least 6 hours before surgery.  No nicotine patches on the day of surgery.  Do not use any recreational drugs for at least a week (preferably 2 weeks) before your surgery.  Please be  advised that the combination of cocaine and anesthesia may have negative outcomes, up to and including death. If you test positive for cocaine, your surgery will be cancelled.  On the morning of surgery brush your teeth with toothpaste and water, you may rinse your mouth with mouthwash if you wish. Do not swallow any toothpaste or mouthwash.  Do not wear jewelry, make-up, hairpins, clips or nail polish.  For welded (permanent) jewelry: bracelets, anklets, waist bands, etc.  Please have this removed prior to surgery.  If it is not removed, there is a chance that hospital personnel will need to cut it off on the day of surgery.  Do not wear lotions, powders, or perfumes.   Do not shave body hair from the neck down 48 hours before surgery.  Contact lenses, hearing aids and dentures may not be worn into surgery.  Do not bring valuables to the hospital. [facility] is not responsible for any missing/lost belongings or valuables.   Notify your doctor if there is any change in your medical condition (cold, fever, infection).  Wear comfortable clothing (specific to your surgery type) to the hospital.  After surgery, you can help prevent lung complications by doing breathing exercises.  Take deep breaths and cough every 1-2 hours. Your doctor may order a device called an Incentive Spirometer to help you take deep breaths.  When coughing or sneezing, hold a pillow firmly against your incision with both hands. This is called "splinting." Doing this helps protect your incision. It  also decreases belly discomfort.  If you are being admitted to the hospital overnight, leave your suitcase in the car. After surgery it may be brought to your room.  In case of increased patient census, it may be necessary for you, the patient, to continue your postoperative care in the Same Day Surgery department.  If you are being discharged the day of surgery, you will not be allowed to drive home. You will need a  responsible individual to drive you home and stay with you for 24 hours after surgery.   If you are taking public transportation, you will need to have a responsible individual with you.  Please call the Pre-admissions Testing Dept. at [phone] if you have any questions about these instructions.  Surgery Visitation Policy:  Patients having surgery or a procedure may have two visitors.  Children under the age of 78 must have an adult with them who is not the patient.  Inpatient Visitation:    Visiting hours are 7 a.m. to 8 p.m. Up to four visitors are allowed at one time in a patient room. The visitors may rotate out with other people during the day.  One visitor age 46 or older may stay with the patient overnight and must be in the room by 8 p.m.   Merchandiser, retail to address health-related social needs:  [email or web address]

## 2023-08-22 ENCOUNTER — Ambulatory Visit: Admitting: Anesthesiology

## 2023-08-22 ENCOUNTER — Ambulatory Visit: Payer: Self-pay | Admitting: Urgent Care

## 2023-08-22 ENCOUNTER — Encounter
Admission: RE | Disposition: A | Discharge status: Home / Self Care | Payer: Self-pay | Source: Home / Self Care | Attending: Surgery

## 2023-08-22 ENCOUNTER — Other Ambulatory Visit: Payer: Self-pay

## 2023-08-22 ENCOUNTER — Ambulatory Visit
Admission: RE | Admit: 2023-08-22 | Discharge: 2023-08-22 | Disposition: A | Discharge status: Home / Self Care | Attending: Surgery | Admitting: Surgery

## 2023-08-22 ENCOUNTER — Encounter: Payer: Self-pay | Admitting: Surgery

## 2023-08-22 DIAGNOSIS — Z87891 Personal history of nicotine dependence: Secondary | ICD-10-CM | POA: Insufficient documentation

## 2023-08-22 DIAGNOSIS — Z79899 Other long term (current) drug therapy: Secondary | ICD-10-CM | POA: Insufficient documentation

## 2023-08-22 DIAGNOSIS — G4733 Obstructive sleep apnea (adult) (pediatric): Secondary | ICD-10-CM | POA: Diagnosis not present

## 2023-08-22 DIAGNOSIS — K5900 Constipation, unspecified: Secondary | ICD-10-CM | POA: Diagnosis not present

## 2023-08-22 DIAGNOSIS — I10 Essential (primary) hypertension: Secondary | ICD-10-CM | POA: Diagnosis not present

## 2023-08-22 DIAGNOSIS — G20A1 Parkinson's disease without dyskinesia, without mention of fluctuations: Secondary | ICD-10-CM | POA: Insufficient documentation

## 2023-08-22 DIAGNOSIS — D176 Benign lipomatous neoplasm of spermatic cord: Secondary | ICD-10-CM | POA: Insufficient documentation

## 2023-08-22 DIAGNOSIS — K4090 Unilateral inguinal hernia, without obstruction or gangrene, not specified as recurrent: Secondary | ICD-10-CM | POA: Diagnosis not present

## 2023-08-22 SURGERY — HERNIORRHAPHY, INGUINAL, ROBOT-ASSISTED, LAPAROSCOPIC
Anesthesia: General | Laterality: Right

## 2023-08-22 MED ORDER — GABAPENTIN 100 MG PO CAPS
ORAL_CAPSULE | ORAL | Status: AC
  Filled 2023-08-22: qty 2

## 2023-08-22 MED ORDER — PROPOFOL 10 MG/ML IV BOLUS
INTRAVENOUS | Status: DC | PRN
Start: 2023-08-22 — End: 2023-08-22
  Administered 2023-08-22: 100 mg via INTRAVENOUS

## 2023-08-22 MED ORDER — KETOROLAC TROMETHAMINE 30 MG/ML IJ SOLN
INTRAMUSCULAR | Status: DC | PRN
  Administered 2023-08-22: 15 mg via INTRAVENOUS

## 2023-08-22 MED ORDER — ROCURONIUM BROMIDE 100 MG/10ML IV SOLN
INTRAVENOUS | Status: DC | PRN
  Administered 2023-08-22: 20 mg via INTRAVENOUS
  Administered 2023-08-22: 50 mg via INTRAVENOUS

## 2023-08-22 MED ORDER — HYDRALAZINE HCL 20 MG/ML IJ SOLN
INTRAMUSCULAR | Status: DC | PRN
  Administered 2023-08-22: 10 mg via INTRAVENOUS

## 2023-08-22 MED ORDER — OXYCODONE HCL 5 MG/5ML PO SOLN: 5.0000 mg | Freq: Once | ORAL | Status: AC | PRN

## 2023-08-22 MED ORDER — FENTANYL CITRATE (PF) 100 MCG/2ML IJ SOLN: 25.0000 ug | INTRAMUSCULAR | Status: DC | PRN

## 2023-08-22 MED ORDER — OXYCODONE HCL 5 MG PO TABS
ORAL_TABLET | ORAL | Status: AC
  Filled 2023-08-22: qty 1

## 2023-08-22 MED ORDER — PHENYLEPHRINE HCL-NACL 20-0.9 MG/250ML-% IV SOLN
INTRAVENOUS | Status: DC | PRN
  Administered 2023-08-22: 24 ug/min via INTRAVENOUS

## 2023-08-22 MED ORDER — CEFAZOLIN SODIUM-DEXTROSE 2-4 GM/100ML-% IV SOLN
INTRAVENOUS | Status: AC
  Filled 2023-08-22: qty 100

## 2023-08-22 MED ORDER — MIDAZOLAM HCL 2 MG/2ML IJ SOLN
INTRAMUSCULAR | Status: DC | PRN
  Administered 2023-08-22: 1 mg via INTRAVENOUS

## 2023-08-22 MED ORDER — SUGAMMADEX SODIUM 200 MG/2ML IV SOLN
INTRAVENOUS | Status: DC | PRN
  Administered 2023-08-22: 150 mg via INTRAVENOUS

## 2023-08-22 MED ORDER — BUPIVACAINE LIPOSOME 1.3 % IJ SUSP
INTRAMUSCULAR | Status: AC
  Filled 2023-08-22: qty 10

## 2023-08-22 MED ORDER — BUPIVACAINE-EPINEPHRINE (PF) 0.5% -1:200000 IJ SOLN
INTRAMUSCULAR | Status: AC
  Filled 2023-08-22: qty 30

## 2023-08-22 MED ORDER — OXYCODONE HCL 5 MG PO TABS
5.0000 mg | ORAL_TABLET | Freq: Once | ORAL | Status: AC | PRN
  Administered 2023-08-22: 5 mg via ORAL

## 2023-08-22 MED ORDER — GLYCOPYRROLATE 0.2 MG/ML IJ SOLN
INTRAMUSCULAR | Status: DC | PRN
  Administered 2023-08-22: .2 mg via INTRAVENOUS

## 2023-08-22 MED ORDER — EPHEDRINE SULFATE-NACL 50-0.9 MG/10ML-% IV SOSY
PREFILLED_SYRINGE | INTRAVENOUS | Status: DC | PRN
  Administered 2023-08-22 (×2): 5 mg via INTRAVENOUS

## 2023-08-22 MED ORDER — ACETAMINOPHEN 500 MG PO TABS: 1000.0000 mg | ORAL_TABLET | Freq: Four times a day (QID) | ORAL | Status: DC | PRN

## 2023-08-22 MED ORDER — OXYCODONE HCL 5 MG PO TABS: 5.0000 mg | ORAL_TABLET | ORAL | 0 refills | Status: DC | PRN

## 2023-08-22 MED ORDER — CHLORHEXIDINE GLUCONATE 0.12 % MT SOLN
OROMUCOSAL | Status: AC
  Filled 2023-08-22: qty 15

## 2023-08-22 MED ORDER — FENTANYL CITRATE (PF) 100 MCG/2ML IJ SOLN
INTRAMUSCULAR | Status: DC | PRN
  Administered 2023-08-22 (×2): 50 ug via INTRAVENOUS

## 2023-08-22 MED ORDER — MIDAZOLAM HCL 2 MG/2ML IJ SOLN
INTRAMUSCULAR | Status: AC
  Filled 2023-08-22: qty 2

## 2023-08-22 MED ORDER — BUPIVACAINE-EPINEPHRINE 0.5% -1:200000 IJ SOLN
INTRAMUSCULAR | Status: DC | PRN
  Administered 2023-08-22: 40 mL via INTRAMUSCULAR

## 2023-08-22 MED ORDER — ACETAMINOPHEN 500 MG PO TABS
ORAL_TABLET | ORAL | Status: AC
  Filled 2023-08-22: qty 2

## 2023-08-22 MED ORDER — LIDOCAINE HCL (CARDIAC) PF 100 MG/5ML IV SOSY
PREFILLED_SYRINGE | INTRAVENOUS | Status: DC | PRN
  Administered 2023-08-22: 40 mg via INTRAVENOUS

## 2023-08-22 MED ORDER — FENTANYL CITRATE (PF) 100 MCG/2ML IJ SOLN
INTRAMUSCULAR | Status: AC
Start: 2023-08-22 — End: 2023-08-22
  Filled 2023-08-22: qty 2

## 2023-08-22 MED ORDER — IBUPROFEN 600 MG PO TABS
600.0000 mg | ORAL_TABLET | Freq: Three times a day (TID) | ORAL | 1 refills | Status: DC | PRN
Start: 2023-08-22 — End: 2023-09-04

## 2023-08-22 SURGICAL SUPPLY — 42 items
COVER TIP SHEARS 8 DVNC (MISCELLANEOUS) ×2 IMPLANT
COVER WAND RF STERILE (DRAPES) ×2 IMPLANT
DEFOGGER SCOPE WARM SEASHARP (MISCELLANEOUS) ×2 IMPLANT
DERMABOND ADVANCED .7 DNX12 (GAUZE/BANDAGES/DRESSINGS) ×2 IMPLANT
DRAPE ARM DVNC X/XI (DISPOSABLE) ×6 IMPLANT
DRAPE COLUMN DVNC XI (DISPOSABLE) ×2 IMPLANT
ELECTRODE CAUTERY BLDE TIP 2.5 (TIP) ×2 IMPLANT
ELECTRODE REM PT RTRN 9FT ADLT (ELECTROSURGICAL) ×2 IMPLANT
FORCEPS BPLR R/ABLATION 8 DVNC (INSTRUMENTS) ×2 IMPLANT
GLOVE SURG SYN 7.0 PF PI (GLOVE) ×4 IMPLANT
GLOVE SURG SYN 7.5 PF PI (GLOVE) ×4 IMPLANT
GOWN STRL REUS W/ TWL LRG LVL3 (GOWN DISPOSABLE) ×8 IMPLANT
IRRIGATION STRYKERFLOW (MISCELLANEOUS) IMPLANT
IV CATH ANGIO 12GX3 LT BLUE (NEEDLE) IMPLANT
IV NS 1000ML BAXH (IV SOLUTION) IMPLANT
KIT PINK PAD W/HEAD ARM REST (MISCELLANEOUS) ×2 IMPLANT
LABEL OR SOLS (LABEL) ×2 IMPLANT
MANIFOLD NEPTUNE II (INSTRUMENTS) ×2 IMPLANT
MESH 3DMAX MID 4X6 RT LRG (Mesh General) IMPLANT
NDL DRIVE SUT CUT DVNC (INSTRUMENTS) ×2 IMPLANT
NDL HYPO 22X1.5 SAFETY MO (MISCELLANEOUS) ×2 IMPLANT
NDL INSUFFLATION 14GA 120MM (NEEDLE) ×2 IMPLANT
NEEDLE DRIVE SUT CUT DVNC (INSTRUMENTS) ×2 IMPLANT
NEEDLE HYPO 22X1.5 SAFETY MO (MISCELLANEOUS) ×2 IMPLANT
NEEDLE INSUFFLATION 14GA 120MM (NEEDLE) ×2 IMPLANT
OBTURATOR OPTICALSTD 8 DVNC (TROCAR) ×2 IMPLANT
PACK LAP CHOLECYSTECTOMY (MISCELLANEOUS) ×2 IMPLANT
SCISSORS MNPLR CVD DVNC XI (INSTRUMENTS) ×2 IMPLANT
SEAL UNIV 5-12 XI (MISCELLANEOUS) ×6 IMPLANT
SET TUBE SMOKE EVAC HIGH FLOW (TUBING) ×2 IMPLANT
SOLUTION ELECTROSURG ANTI STCK (MISCELLANEOUS) ×2 IMPLANT
SPONGE T-LAP 18X18 ~~LOC~~+RFID (SPONGE) ×2 IMPLANT
SUT MNCRL AB 4-0 PS2 18 (SUTURE) ×2 IMPLANT
SUT STRATA 2-0 23CM CT-2 (SUTURE) ×2 IMPLANT
SUT VIC AB 2-0 SH 27XBRD (SUTURE) ×2 IMPLANT
SUT VIC AB 3-0 SH 27X BRD (SUTURE) ×2 IMPLANT
SUT VICRYL 0 UR6 27IN ABS (SUTURE) ×4 IMPLANT
SYSTEM BAG RETRIEVAL 10MM (BASKET) IMPLANT
TAPE TRANSPORE STRL 2 31045 (GAUZE/BANDAGES/DRESSINGS) ×2 IMPLANT
TRAP FLUID SMOKE EVACUATOR (MISCELLANEOUS) ×2 IMPLANT
TRAY FOLEY SLVR 16FR LF STAT (SET/KITS/TRAYS/PACK) ×2 IMPLANT
WATER STERILE IRR 500ML POUR (IV SOLUTION) ×2 IMPLANT

## 2023-08-22 NOTE — Anesthesia Preprocedure Evaluation (Signed)
 Anesthesia Evaluation  Patient identified by MRN, date of birth, ID band Patient awake    Reviewed: Allergy & Precautions, NPO status , Patient's Chart, lab work & pertinent test results  History of Anesthesia Complications Negative for: history of anesthetic complications  Airway Mallampati: III  TM Distance: <3 FB Neck ROM: full    Dental  (+) Chipped   Pulmonary neg shortness of breath, sleep apnea , former smoker   Pulmonary exam normal        Cardiovascular Exercise Tolerance: Good hypertension, (-) angina Normal cardiovascular exam     Neuro/Psych  Neuromuscular disease  negative psych ROS   GI/Hepatic negative GI ROS, Neg liver ROS,neg GERD  ,,  Endo/Other  negative endocrine ROS    Renal/GU      Musculoskeletal   Abdominal   Peds  Hematology negative hematology ROS (+)   Anesthesia Other Findings Past Medical History: No date: Cancer (HCC)     Comment:  top of head No date: Difficulty swallowing No date: ED (erectile dysfunction) No date: History of mumps No date: Hypertension No date: OSA (obstructive sleep apnea)     Comment:  No CPAP No date: Parkinson disease (HCC) No date: Pre-diabetes No date: Prediabetes No date: Restless leg No date: Right inguinal hernia No date: Wears hearing aid in both ears  Past Surgical History: 06/18/2022: CATARACT EXTRACTION W/PHACO; Right     Comment:  Procedure: CATARACT EXTRACTION PHACO AND INTRAOCULAR               LENS PLACEMENT (IOC) RIGHT KAHOOK DUAL BLADE GONIOTOMY                9.20  01:03.1;  Surgeon: Enola Feliciano Hugger, MD;                Location: [facility];  Service: Ophthalmology;                Laterality: Right; 07/05/2022: CATARACT EXTRACTION W/PHACO; Left     Comment:  Procedure: CATARACT EXTRACTION PHACO AND INTRAOCULAR               LENS PLACEMENT (IOC) LEFT KAHOOK DUAL BLADE GONIOTOMY               16.10 01:26.7;  Surgeon:  Enola Feliciano Hugger, MD;                Location: [facility];  Service: Ophthalmology;                Laterality: Left; No date: COLONOSCOPY 04/24/2013: Sleep study     Comment:  AHI=15.9, RDI=17.1 on MSPG  BMI    Body Mass Index: 19.63 kg/m      Reproductive/Obstetrics negative OB ROS                              Anesthesia Physical Anesthesia Plan  ASA: 3  Anesthesia Plan: General ETT   Post-op Pain Management:    Induction: Intravenous  PONV Risk Score and Plan: Ondansetron, Dexamethasone, Midazolam  and Treatment may vary due to age or medical condition  Airway Management Planned: Oral ETT  Additional Equipment:   Intra-op Plan:   Post-operative Plan: Extubation in OR  Informed Consent: I have reviewed the patients History and Physical, chart, labs and discussed the procedure including the risks, benefits and alternatives for the proposed anesthesia with the patient or authorized representative who has indicated his/her understanding and acceptance.  Dental Advisory Given  Plan Discussed with: Anesthesiologist, CRNA and Surgeon  Anesthesia Plan Comments: (Patient consented for risks of anesthesia including but not limited to:  - adverse reactions to medications - damage to eyes, teeth, lips or other oral mucosa - nerve damage due to positioning  - sore throat or hoarseness - Damage to heart, brain, nerves, lungs, other parts of body or loss of life  Patient voiced understanding and assent.)        Anesthesia Quick Evaluation

## 2023-08-22 NOTE — Discharge Instructions (Signed)
 Discharge Instructions: 1.  Patient may shower, but do not scrub wounds heavily and dab dry only. 2.  Do not submerge wounds in pool/tub until fully healed. 3.  Do not apply ointments or hydrogen peroxide to the wounds. 4.  May apply ice packs to the wounds for comfort. 5.  It is normal for there to be swelling or bruising in the groin and scrotum area after this surgery.  It will resolve on its own. 6.  Do not drive while taking narcotics for pain control.  Prior to driving, make sure you are able to rotate right and left to look at blindspots without significant pain or discomfort. 7.  No heavy lifting or pushing of more than 10-15 lbs for 6 weeks.

## 2023-08-22 NOTE — Anesthesia Procedure Notes (Signed)
 Procedure Name: Intubation Date/Time: 08/22/2023 11:16 AM  Performed by: Vannie Smaller, CRNAPre-anesthesia Checklist: Patient identified, Patient being monitored, Timeout performed, Emergency Drugs available and Suction available Patient Re-evaluated:Patient Re-evaluated prior to induction Oxygen Delivery Method: Circle system utilized Preoxygenation: Pre-oxygenation with 100% oxygen Induction Type: IV induction Ventilation: Mask ventilation without difficulty Laryngoscope Size: Mac and 3 Grade View: Grade I Tube type: Oral Tube size: 7.0 mm Number of attempts: 1 Airway Equipment and Method: Stylet Placement Confirmation: ETT inserted through vocal cords under direct vision, positive ETCO2 and breath sounds checked- equal and bilateral Secured at: 21 cm Tube secured with: Tape Dental Injury: Teeth and Oropharynx as per pre-operative assessment

## 2023-08-22 NOTE — Anesthesia Postprocedure Evaluation (Signed)
 Anesthesia Post Note  Patient: Alexander Espinoza  Procedure(s) Performed: HERNIORRHAPHY, INGUINAL, ROBOT-ASSISTED, LAPAROSCOPIC (Right) INSERTION OF MESH  Patient location during evaluation: PACU Anesthesia Type: General Level of consciousness: awake and alert Pain management: pain level controlled Vital Signs Assessment: post-procedure vital signs reviewed and stable Respiratory status: spontaneous breathing, nonlabored ventilation and respiratory function stable Cardiovascular status: blood pressure returned to baseline and stable Postop Assessment: no apparent nausea or vomiting Anesthetic complications: no   No notable events documented.   Last Vitals:  Vitals:   08/22/23 1445 08/22/23 1452  BP: 109/66   Pulse: 66 71  Resp: 19 19  Temp: (!) 36.1 C   SpO2: 98% 99%    Last Pain:  Vitals:   08/22/23 1452  TempSrc:   PainSc: 4                  Fairy POUR Lekeith Wulf

## 2023-08-22 NOTE — Progress Notes (Signed)
 Patient legs buckled while getting up to stand from recliner. He has not had any food since yesterday and has missed two of his Sinemet doses. I gave him some crackers and something to drink. About 30 min later he got up with assistance, walked to the bathroom and urinated. Patient walked from the toilet to the W/C without difficulty but still shaky. Advised patient and family to use walker until he is caught up on his sinemet doses and is more steady. Patient and family agree with plan of care. Dr Desiderio was made aware.

## 2023-08-22 NOTE — Interval H&P Note (Signed)
 History and Physical Interval Note:  08/22/2023 9:12 AM  Alexander Espinoza  has presented today for surgery, with the diagnosis of Right inguinal hernia.  The various methods of treatment have been discussed with the patient and family. After consideration of risks, benefits and other options for treatment, the patient has consented to  Procedure(s): HERNIORRHAPHY, INGUINAL, ROBOT-ASSISTED, LAPAROSCOPIC (Right) as a surgical intervention.  The patient's history has been reviewed, patient examined, no change in status, stable for surgery.  I have reviewed the patient's chart and labs.  Questions were answered to the patient's satisfaction.     Raphael Fitzpatrick

## 2023-08-22 NOTE — Op Note (Signed)
 Procedure Date:  08/22/2023  Pre-operative Diagnosis:  Right inguinal hernia  Post-operative Diagnosis: Right inguinal hernia  Procedure: 1.  Robotic assisted Right Inguinal Hernia Repair 2.  Creation of Right Posterior Rectus-Transversalis Fascia Advancment Flap for Coverage of Pelvic Wound (200 cm)  Surgeon:  Aloysius Sheree Plant, MD  Anesthesia:  General endotracheal  Estimated Blood Loss:  10 ml  Specimens:  None  Complications:  None  Indications for Procedure:  This is a 83 y.o. male who presents with a right inguinal hernia.  The options of surgery versus observation were reviewed with the patient and/or family. The risks of bleeding, abscess or infection, recurrence of symptoms, potential for an open procedure, injury to surrounding structures, and chronic pain were all discussed with the patient and he was willing to proceed.  We have planned this transabdominal procedure with the creation of a right peritoneal flap based on the posterior rectus sheath and transversalis fascia in order to fully cover the mesh, creating a natural tisssue barrier for the bowel and peritoneal cavity.  Description of Procedure: The patient was correctly identified in the preoperative area and brought into the operating room.  The patient was placed supine with VTE prophylaxis in place.  Appropriate time-outs were performed.  Anesthesia was induced and the patient was intubated.  Foley catheter was placed.  Appropriate antibiotics were infused.  The abdomen was prepped and draped in a sterile fashion. A supraumbilical incision was made. A cutdown technique was used to enter the abdominal cavity without injury, and a Hasson trocar was inserted.  Pneumoperitoneum was obtained with appropriate opening pressures.  A Veress needle was used to start dissecting the peritoneal flap.  Two 8-mm robotic ports were placed in the right and left lateral positions under direct visualization.  A large right Bard 3D Max  Mesh, a 2-0 Vicryl, and 2-0 stratafix suture were placed through the umbilical port under direct visualization.  The [employer] platform was docked onto the patient, the camera was inserted and targeted, and the instruments were placed under direct visualization.  Both inguinal regions were inspected for hernias and it was confirmed that the patient had a right indirect inguinal hernia.  Using electocautery, the peritoneal and posterior rectus tissue flap was created.  The peritoneum on the right side was scored from the median umbilical ligament laterally towards the ASIS.  The flap was mobilized using robotic scissors and the bipolar instruments, creating a plane along the posterior rectus sheath and transversalis fascia down to the pubic tubercle medially. It was then further mobilized laterally across the inguinal canal and femoral vessels and onto the psoas muscle. The inferior epigastric vessels were identified and preserved. This created a posterior rectus and peritoneal flap measuring roughly 17 cm x 12 cm.  The hernia sac and contents were reduced preserving all structures.  A large cord lipoma was also resected.  A large right Bard 3D Max Mid mesh was placed with good overlap along all the potential hernia defects and secured in place with 2-0 Vicryl along the medial superomedial and superolateral aspects.  Then, the peritoneal flap was advanced over the mesh and carried over to close the defect. A running 2-0 stratafix suture was used to approximate the edge of the flap onto the peritoneum.  The cord lipoma was placed in an Endocatch bag.  All needles were removed under direct visualization.  The 8- mm ports were removed under direct visualization and the 12 mm port was removed.  The Endocatch bag  was removed.  The fascial opening was closed using 0 vicryl suture.  Local anesthetic was infused in all incisions as well as a right ilioinguinal block, and the incisions were closed with 4-0 Monocryl.  The  wounds were cleaned and sealed with DermaBond.  Foley catheter was removed and the patient was emerged from anesthesia and extubated and brought to the recovery room for further management.  The patient tolerated the procedure well and all counts were correct at the end of the case.   Aloysius Sheree Plant, MD

## 2023-08-22 NOTE — Transfer of Care (Signed)
 Immediate Anesthesia Transfer of Care Note  Patient: Alexander Espinoza  Procedure(s) Performed: HERNIORRHAPHY, INGUINAL, ROBOT-ASSISTED, LAPAROSCOPIC (Right) INSERTION OF MESH  Patient Location: PACU  Anesthesia Type:General  Level of Consciousness: drowsy  Airway & Oxygen Therapy: Patient Spontanous Breathing and Patient connected to face mask oxygen  Post-op Assessment: Report given to RN and Post -op Vital signs reviewed and stable  Post vital signs: Reviewed and stable  Last Vitals:  Vitals Value Taken Time  BP 99/58 08/22/23 13:30  Temp    Pulse 65 08/22/23 13:31  Resp 15 08/22/23 13:31  SpO2 100 % 08/22/23 13:31  Vitals shown include unfiled device data.  Last Pain:  Vitals:   08/22/23 0908  TempSrc: Temporal  PainSc: 0-No pain         Complications: No notable events documented.

## 2023-08-23 ENCOUNTER — Encounter: Payer: Self-pay | Admitting: Surgery

## 2023-08-24 ENCOUNTER — Other Ambulatory Visit: Payer: Self-pay | Admitting: Family Medicine

## 2023-08-24 DIAGNOSIS — R634 Abnormal weight loss: Secondary | ICD-10-CM

## 2023-08-24 DIAGNOSIS — I10 Essential (primary) hypertension: Secondary | ICD-10-CM

## 2023-09-04 ENCOUNTER — Ambulatory Visit (INDEPENDENT_AMBULATORY_CARE_PROVIDER_SITE_OTHER): Admitting: Physician Assistant

## 2023-09-04 ENCOUNTER — Encounter: Payer: Self-pay | Admitting: Physician Assistant

## 2023-09-04 VITALS — BP 134/83 | HR 53 | Temp 98.1°F | Ht 69.0 in | Wt 136.0 lb

## 2023-09-04 DIAGNOSIS — Z09 Encounter for follow-up examination after completed treatment for conditions other than malignant neoplasm: Secondary | ICD-10-CM

## 2023-09-04 DIAGNOSIS — K4090 Unilateral inguinal hernia, without obstruction or gangrene, not specified as recurrent: Secondary | ICD-10-CM

## 2023-09-04 NOTE — Patient Instructions (Signed)

## 2023-09-04 NOTE — Progress Notes (Signed)
 [facility] POST-OP OFFICE VISIT  09/04/2023  HPI: Alexander Espinoza is a 83 y.o. male 13 days s/p robotic assisted laparoscopic right inguinal hernia repair with Dr Desiderio   He is doing extremely well He had soreness for the first day only, only needed 1 ibuprofen  No fever, chills, nausea, emesis, urinary changes, or bowel changes No scrotal swelling nor ecchymosis  Incisions are healing well without issue No other complaints   Vital signs: BP 134/83   Pulse (!) 53   Temp 98.1 F (36.7 C) (Oral)   Ht 5' 9 (1.753 m)   Wt 136 lb (61.7 kg)   SpO2 97%   BMI 20.08 kg/m    Physical Exam: Constitutional: Well appearing male, NAD Abdomen: Soft, non-tender, non-distended, no rebound/guarding Skin: Laparoscopic incisions are healing well, no erythema or drainage   Assessment/Plan: This is a 83 y.o. male 13 days s/p robotic assisted laparoscopic right inguinal hernia repair with Dr Desiderio    - Pain control prn  - Reviewed wound care recommendation  - Reviewed lifting restrictions; 6 weeks total  - He can follow up on as needed basis; He understands to call with questions/concerns  -- Arthea Platt, PA-C [facility] 09/04/2023, 10:34 AM M-F: 7am - 4pm

## 2023-10-03 ENCOUNTER — Encounter: Payer: Self-pay | Admitting: Family Medicine

## 2023-10-07 ENCOUNTER — Ambulatory Visit: Admitting: Family Medicine

## 2023-10-07 ENCOUNTER — Encounter: Payer: Self-pay | Admitting: Family Medicine

## 2023-10-07 VITALS — BP 117/70 | HR 54 | Resp 14 | Ht 69.0 in | Wt 134.3 lb

## 2023-10-07 DIAGNOSIS — I10 Essential (primary) hypertension: Secondary | ICD-10-CM | POA: Diagnosis not present

## 2023-10-07 DIAGNOSIS — G20C Parkinsonism, unspecified: Secondary | ICD-10-CM | POA: Diagnosis not present

## 2023-10-07 DIAGNOSIS — Z23 Encounter for immunization: Secondary | ICD-10-CM | POA: Diagnosis not present

## 2023-10-07 NOTE — Patient Instructions (Signed)
 [name]  Please review the attached list of medications and notify my office if there are any errors.   . Please bring all of your medications to every appointment so we can make sure that our medication list is the same as yours.

## 2023-10-07 NOTE — Progress Notes (Signed)
 Established patient visit   Patient: Alexander Espinoza   DOB: [birth year]   83 y.o. Male  MRN: [id number] Visit Date: 10/07/2023  Today's healthcare provider: Nancyann Perry, MD   Chief Complaint  Patient presents with   Medical Management of Chronic Issues    Htn- readings good    Subjective    Discussed the use of AI scribe software for clinical note transcription with the patient, who gave verbal consent to proceed.  History of Present Illness   Alexander Espinoza is an 83 year old male with hypertension and Parkinson's disease who presents for a routine checkup and follow-up on blood pressure management.  His blood pressure has been fluctuating over the past few months, but it is stable today. He does not monitor his blood pressure at home. No dizziness, lightheadedness, or wheezing. He mentions difficulty getting up from a seated position, and his caregiver notes occasional disorientation.  He has a history of Parkinson's disease and is currently taking medication for it four times a day. His caregiver notes that he has experienced visual hallucinations on a few occasions and sometimes becomes disoriented. He ensures he has food in his stomach before taking his medications.  He underwent heart surgery since his last visit and reports that he has healed well from the procedure.  He has a history of actinic keratoses, which have been treated by a dermatologist through freezing, but they tend to recur.  He quit smoking in 1972, which he considers a significant positive change in his health habits. Smoking was prevalent in his family, but he stopped to avoid exposing a baby in the family to smoke.       Medications: Outpatient Medications Prior to Visit  Medication Sig   amLODipine  (NORVASC ) 2.5 MG tablet Take 1 tablet (2.5 mg total) by mouth daily.   brimonidine -timolol  (COMBIGAN ) 0.2-0.5 % ophthalmic solution Place 1 drop into both eyes at bedtime.   carbidopa-levodopa  (SINEMET IR) 25-100 MG tablet Take 1 tablet by mouth 3 (three) times daily.   latanoprost (XALATAN) 0.005 % ophthalmic solution Place 1 drop into both eyes at bedtime.   losartan -hydrochlorothiazide (HYZAAR) 50-12.5 MG tablet TAKE (1) TABLET BY MOUTH EVERY DAY   metoprolol  succinate (TOPROL -XL) 100 MG 24 hr tablet TAKE (1) TABLET BY MOUTH EVERY DAY   mirtazapine  (REMERON ) 15 MG tablet TAKE (1) TABLET BY MOUTH AT BEDTIME   vitamin B-12 (CYANOCOBALAMIN ) 500 MCG tablet Take 500 mcg by mouth daily.   No facility-administered medications prior to visit.   Review of Systems  Constitutional:  Negative for appetite change, chills and fever.  Respiratory:  Negative for chest tightness, shortness of breath and wheezing.   Cardiovascular:  Negative for chest pain and palpitations.  Gastrointestinal:  Negative for abdominal pain, nausea and vomiting.       Objective    BP 117/70   Pulse (!) 54   Resp 14   Ht 5' 9 (1.753 m)   Wt 134 lb 4.8 oz (60.9 kg)   SpO2 100%   BMI 19.83 kg/m   Physical Exam   General: Appearance:    Thin male in no acute distress  Eyes:    PERRL, conjunctiva/corneas clear, EOM's intact       Lungs:     Clear to auscultation bilaterally, respirations unlabored  Heart:    Bradycardic. Normal rhythm. No murmurs, rubs, or gallops.    MS:   All extremities are intact.    Neurologic:  Awake, alert, oriented x 3. No apparent focal neurological defect.         Assessment & Plan    1. Primary hypertension (Primary) Well controlled.  Continue current medications.    2. Parkinsonism, unspecified Parkinsonism type (HCC) Stable, continue current plan of care and follow up with Dr. Maree  Other orders - Flu vaccine HIGH DOSE PF(Fluzone Trivalent)   Return in about 4 months (around 02/06/2024).     Nancyann Perry, MD  [facility] [phone] (phone) [phone] (fax)  [facility]

## 2023-10-09 DIAGNOSIS — Z85828 Personal history of other malignant neoplasm of skin: Secondary | ICD-10-CM | POA: Diagnosis not present

## 2023-10-09 DIAGNOSIS — L578 Other skin changes due to chronic exposure to nonionizing radiation: Secondary | ICD-10-CM | POA: Diagnosis not present

## 2023-10-09 DIAGNOSIS — Z86018 Personal history of other benign neoplasm: Secondary | ICD-10-CM | POA: Diagnosis not present

## 2023-10-09 DIAGNOSIS — Z872 Personal history of diseases of the skin and subcutaneous tissue: Secondary | ICD-10-CM | POA: Diagnosis not present

## 2023-11-11 DIAGNOSIS — H401112 Primary open-angle glaucoma, right eye, moderate stage: Secondary | ICD-10-CM | POA: Diagnosis not present

## 2023-11-11 DIAGNOSIS — H401122 Primary open-angle glaucoma, left eye, moderate stage: Secondary | ICD-10-CM | POA: Diagnosis not present

## 2023-11-18 DIAGNOSIS — L570 Actinic keratosis: Secondary | ICD-10-CM | POA: Diagnosis not present

## 2023-11-20 DIAGNOSIS — H401122 Primary open-angle glaucoma, left eye, moderate stage: Secondary | ICD-10-CM | POA: Diagnosis not present

## 2023-11-20 DIAGNOSIS — H401112 Primary open-angle glaucoma, right eye, moderate stage: Secondary | ICD-10-CM | POA: Diagnosis not present

## 2023-11-20 DIAGNOSIS — H35371 Puckering of macula, right eye: Secondary | ICD-10-CM | POA: Diagnosis not present

## 2023-11-20 DIAGNOSIS — H35372 Puckering of macula, left eye: Secondary | ICD-10-CM | POA: Diagnosis not present

## 2023-12-23 ENCOUNTER — Other Ambulatory Visit: Payer: Self-pay | Admitting: Family Medicine

## 2023-12-23 DIAGNOSIS — I10 Essential (primary) hypertension: Secondary | ICD-10-CM

## 2023-12-26 ENCOUNTER — Ambulatory Visit: Attending: Neurology | Admitting: Speech Pathology

## 2023-12-26 DIAGNOSIS — R1312 Dysphagia, oropharyngeal phase: Secondary | ICD-10-CM | POA: Diagnosis present

## 2023-12-26 NOTE — Therapy (Unsigned)
 OUTPATIENT SPEECH LANGUAGE PATHOLOGY  SWALLOW EVALUATION   Patient Name: Alexander Espinoza MRN: [id number] DOB:Sep 08, 1940, 83 y.o., male Today's Date: 12/26/2023  PCP: PIERRETTE REFERRING PROVIDER: ***   End of Session - 12/26/23 2053     Visit Number 1    Number of Visits 9    Date for Recertification  02/20/24    Authorization Type [employer]    Progress Note Due on Visit 10    SLP Start Time 1325    SLP Stop Time  1400    SLP Time Calculation (min) 35 min    Activity Tolerance Patient tolerated treatment well          Past Medical History:  Diagnosis Date   Cancer (HCC)    top of head   Difficulty swallowing    ED (erectile dysfunction)    History of mumps    Hypertension    OSA (obstructive sleep apnea)    No CPAP   Parkinson disease (HCC)    Pre-diabetes    Prediabetes    Restless leg    Right inguinal hernia    Wears hearing aid in both ears    Past Surgical History:  Procedure Laterality Date   CATARACT EXTRACTION W/PHACO Right 06/18/2022   Procedure: CATARACT EXTRACTION PHACO AND INTRAOCULAR LENS PLACEMENT (IOC) RIGHT KAHOOK DUAL BLADE GONIOTOMY  9.20  01:03.1;  Surgeon: Enola Feliciano Hugger, MD;  Location: [facility];  Service: Ophthalmology;  Laterality: Right;   CATARACT EXTRACTION W/PHACO Left 07/05/2022   Procedure: CATARACT EXTRACTION PHACO AND INTRAOCULAR LENS PLACEMENT (IOC) LEFT KAHOOK DUAL BLADE GONIOTOMY 16.10 01:26.7;  Surgeon: Enola Feliciano Hugger, MD;  Location: [facility];  Service: Ophthalmology;  Laterality: Left;   COLONOSCOPY     HERNIORRHAPHY, INGUINAL, ROBOT-ASSISTED, LAPAROSCOPIC Right 08/22/2023   Procedure: HERNIORRHAPHY, INGUINAL, ROBOT-ASSISTED, LAPAROSCOPIC;  Surgeon: Desiderio Schanz, MD;  Location: [facility];  Service: General;  Laterality: Right;   INSERTION OF MESH  08/22/2023   Procedure: INSERTION OF MESH;  Surgeon: Desiderio Schanz, MD;  Location: [facility];  Service: General;;   Sleep study  04/24/2013    AHI=15.9, RDI=17.1 on MSPG   Patient Active Problem List   Diagnosis Date Noted   Parkinsonism (HCC) 10/07/2023   Right inguinal hernia 08/22/2023   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   Smoking history 01/23/2017   Bursitis of right shoulder 08/08/2016   Erectile dysfunction 01/18/2015   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/2015   Prediabetes 01/18/2015   Restless leg 01/18/2015   Sleep disturbance 01/18/2015   Tremor 01/18/2015   Obstructive sleep apnea 04/24/2013   Hypertension 10/11/2011   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 {OPRCPAIN:27236}  FALLS: Has patient fallen in last 6 months?  {DOEQJOOD:74320}  LIVING ENVIRONMENT: Lives with: {OPRC lives with:25569::lives with their family} Lives in: {Lives in:25570}  PLOF:  Level of assistance: {DOEEONQ:74326} Employment: {SLPemployment:25674}   PATIENT GOALS  ***  OBJECTIVE:  RECOMMENDATIONS FROM OBJECTIVE SWALLOW STUDY (MBSS/FEES):  *** Objective swallow impairments: *** Objective recommended compensations: ***  COGNITION: Overall  cognitive status: {cognition:24006} Areas of impairment:  {ARMCcognitiveimpairment:28858} Functional deficits: ***  ORAL MOTOR EXAMINATION Facial : {OM Face:25663} Lingual: {OM Lingual:25664} Velum: {OM Velum:25665} Mandible: {OM mandible:25667} Cough: {OM Cough:25660} Voice: {OM Voice:25662}   CLINICAL SWALLOW ASSESSMENT:   Current diet: {slpdiet:27196} Dentition: {slpdentition:27818} Feeding:  {slp feeding:27200} Consistencies tested: {slpconsistencies:27815}   Evaluation findings: {SLPswallowresults:27796}  Aspiration risk factors:{SLPaspirationfactors:27797} Overall aspiration risk:{slpaspirationrisk:27798} Diet Recommendations: {slpdiet:27196} Precautions:{slpprecautions:27799} Supervision: {slpsupervision:27802} Oral care recommendations:{slporalcarerec:27800} Follow-up recommendations: {slpfollowup:27801}     PATIENT REPORTED OUTCOME MEASURES (PROM): {SLPPROM:27785}  TODAY'S TREATMENT:  ***   PATIENT EDUCATION: Education details: *** Person educated: {Person educated:25204} Education method: {Education Method:25205} Education comprehension: {Education Comprehension:25206}  HOME EXERCISE PROGRAM:     ***   GOALS: Goals reviewed with patient? {yes/no:20286}  SHORT TERM GOALS: Target date: 10 sessions  *** Baseline: Goal status: {GOALSTATUS:25110}  2.  *** Baseline:  Goal status: {GOALSTATUS:25110}  3.  *** Baseline:  Goal status: {GOALSTATUS:25110}  4.  *** Baseline:  Goal status: {GOALSTATUS:25110}  5.  *** Baseline:  Goal status: {GOALSTATUS:25110}  6.  *** Baseline:  Goal status: {GOALSTATUS:25110}  LONG TERM GOALS: Target date:   *** Baseline:  Goal status: {GOALSTATUS:25110}  2.  *** Baseline:  Goal status: {GOALSTATUS:25110}  3.  *** Baseline:  Goal status: {GOALSTATUS:25110}  4.  *** Baseline:  Goal status: {GOALSTATUS:25110}  5.  *** Baseline:  Goal status: {GOALSTATUS:25110}  6.  *** Baseline:  Goal status: {GOALSTATUS:25110}  ASSESSMENT:  CLINICAL IMPRESSION: Patient is a *** y.o. *** who was seen today for ***.   OBJECTIVE IMPAIRMENTS include {SLPOBJIMP:27107}. These impairments are limiting patient from {SLPLIMIT:27108}. Factors affecting potential to achieve goals and functional outcome are {SLP factors:25450}. Patient will benefit from skilled SLP services to address above impairments and improve  overall function.  REHAB POTENTIAL: {rehabpotential:25112}  PLAN: SLP FREQUENCY: {rehab frequency:25116}  SLP DURATION: {rehab duration:25117}  PLANNED INTERVENTIONS: {SLP treatment/interventions:25449}   Deshay Blumenfeld B. Rubbie, M.S., CCC-SLP, Tree Surgeon Certified Brain Injury Specialist [facility]  [facility] Office [phone] Ascom [phone] Fax [phone]

## 2023-12-30 ENCOUNTER — Ambulatory Visit: Admitting: Speech Pathology

## 2024-01-01 ENCOUNTER — Ambulatory Visit: Admitting: Speech Pathology

## 2024-01-06 ENCOUNTER — Ambulatory Visit: Admitting: Speech Pathology

## 2024-01-08 ENCOUNTER — Ambulatory Visit: Admitting: Speech Pathology

## 2024-01-13 ENCOUNTER — Ambulatory Visit: Admitting: Speech Pathology

## 2024-01-15 ENCOUNTER — Ambulatory Visit: Admitting: Speech Pathology

## 2024-01-27 ENCOUNTER — Ambulatory Visit: Admitting: Speech Pathology

## 2024-01-29 ENCOUNTER — Ambulatory Visit: Admitting: Speech Pathology

## 2024-02-03 ENCOUNTER — Ambulatory Visit: Admitting: Speech Pathology

## 2024-02-05 ENCOUNTER — Ambulatory Visit: Admitting: Speech Pathology

## 2024-02-07 ENCOUNTER — Ambulatory Visit: Admitting: Family Medicine

## 2024-02-07 ENCOUNTER — Encounter: Payer: Self-pay | Admitting: Family Medicine

## 2024-02-07 VITALS — BP 158/82 | HR 58 | Resp 16 | Ht 68.0 in | Wt 138.0 lb

## 2024-02-07 DIAGNOSIS — R739 Hyperglycemia, unspecified: Secondary | ICD-10-CM

## 2024-02-07 DIAGNOSIS — I10 Essential (primary) hypertension: Secondary | ICD-10-CM

## 2024-02-07 DIAGNOSIS — R7303 Prediabetes: Secondary | ICD-10-CM | POA: Diagnosis not present

## 2024-02-07 DIAGNOSIS — G4733 Obstructive sleep apnea (adult) (pediatric): Secondary | ICD-10-CM

## 2024-02-07 LAB — POCT GLYCOSYLATED HEMOGLOBIN (HGB A1C): Hemoglobin A1C: 7.2 % — AB (ref 4.0–5.6)

## 2024-02-07 NOTE — Patient Instructions (Signed)
 [name]  Please review the attached list of medications and notify my office if there are any errors.   . Please bring all of your medications to every appointment so we can make sure that our medication list is the same as yours.

## 2024-02-07 NOTE — Progress Notes (Unsigned)
" °  ° ° °  Established patient visit   Patient: Alexander Espinoza   DOB: 1940/03/15   84 y.o. Male  MRN: [id number] Visit Date: 02/07/2024  Today's healthcare provider: Nancyann Perry, MD   Chief Complaint  Patient presents with   Follow-up    4 month DM f/u   Subjective    Discussed the use of AI scribe software for clinical note transcription with the patient, who gave verbal consent to proceed.  History of Present Illness   Alexander Espinoza is an 84 year old male who presents for a routine checkup to monitor blood pressure and blood sugar levels.  He experiences fatigue, necessitating two to three naps daily, and describes feeling 'gives out every day.' Despite efforts to increase food intake, he has only gained a couple of pounds since his last visit. He enjoys eating but consumes smaller meals three times a day.  He does not monitor his blood pressure at home due to the lack of a blood pressure cuff.  His blood sugar levels, including A1c, were checked and reported to be good, with an A1c of 5.7, which has improved since last year.  He is currently taking vitamin B12 daily.       Medications: Show/hide medication list[1] Review of Systems {Insert previous labs (optional):23779} {See past labs  Heme  Chem  Endocrine  Serology  Results Review (optional):1}   Objective    BP (!) 158/82   Pulse (!) 58   Resp 16   Ht 5' 8 (1.727 m)   Wt 138 lb (62.6 kg)   SpO2 100%   BMI 20.98 kg/m {Insert last BP/Wt (optional):23777}{See vitals history (optional):1}  Physical Exam   General: Appearance:    Well developed, well nourished male in no acute distress  Eyes:    PERRL, conjunctiva/corneas clear, EOM's intact       Lungs:     Clear to auscultation bilaterally, respirations unlabored  Heart:    Bradycardic. Normal rhythm. No murmurs, rubs, or gallops.    MS:   All extremities are intact.    Neurologic:   Awake, alert, oriented x 3. No apparent focal neurological defect.        A1c = ***.***    Assessment & Plan    1. Primary hypertension (Primary) Up a bit today. Recommend monitoring home BP and let me know if consistently over 140.   2. Prediabetes Stable  Continue current medications.    Return in about 4 months (around 06/06/2024).     Nancyann Perry, MD  [facility] [phone] (phone) [phone] (fax)  [facility]     [1]  Outpatient Medications Prior to Visit  Medication Sig   amLODipine  (NORVASC ) 2.5 MG tablet TAKE (1) TABLET BY MOUTH EVERY DAY   brimonidine -timolol  (COMBIGAN ) 0.2-0.5 % ophthalmic solution Place 1 drop into both eyes at bedtime.   carbidopa-levodopa (SINEMET IR) 25-100 MG tablet Take 1 tablet by mouth 3 (three) times daily.   latanoprost (XALATAN) 0.005 % ophthalmic solution Place 1 drop into both eyes at bedtime.   losartan -hydrochlorothiazide (HYZAAR) 50-12.5 MG tablet TAKE (1) TABLET BY MOUTH EVERY DAY   metoprolol  succinate (TOPROL -XL) 100 MG 24 hr tablet TAKE (1) TABLET BY MOUTH EVERY DAY   mirtazapine  (REMERON ) 15 MG tablet TAKE (1) TABLET BY MOUTH AT BEDTIME   vitamin B-12 (CYANOCOBALAMIN ) 500 MCG tablet Take 500 mcg by mouth daily.   No facility-administered medications prior to visit.   "

## 2024-02-10 ENCOUNTER — Ambulatory Visit: Admitting: Speech Pathology

## 2024-02-12 ENCOUNTER — Ambulatory Visit: Admitting: Speech Pathology

## 2024-02-17 ENCOUNTER — Ambulatory Visit: Admitting: Speech Pathology

## 2024-02-19 ENCOUNTER — Ambulatory Visit: Admitting: Speech Pathology

## 2024-03-04 ENCOUNTER — Ambulatory Visit: Payer: PPO

## 2024-06-08 ENCOUNTER — Ambulatory Visit: Admitting: Family Medicine
